# Patient Record
Sex: Female | Born: 1980
Health system: Southern US, Community
[De-identification: ages and names within clinical notes are randomized; demographics above are authoritative.]

## PROBLEM LIST (undated history)

## (undated) DIAGNOSIS — Z789 Other specified health status: Secondary | ICD-10-CM

## (undated) DIAGNOSIS — Z3490 Encounter for supervision of normal pregnancy, unspecified, unspecified trimester: Secondary | ICD-10-CM

---

## 2006-05-09 ENCOUNTER — Emergency Department (HOSPITAL_COMMUNITY): Admission: EM | Admit: 2006-05-09 | Discharge: 2006-05-09 | Payer: Self-pay | Admitting: Emergency Medicine

## 2006-06-13 ENCOUNTER — Emergency Department (HOSPITAL_COMMUNITY): Admission: EM | Admit: 2006-06-13 | Discharge: 2006-06-14 | Payer: Self-pay | Admitting: Emergency Medicine

## 2006-08-20 ENCOUNTER — Emergency Department (HOSPITAL_COMMUNITY): Admission: EM | Admit: 2006-08-20 | Discharge: 2006-08-20 | Payer: Self-pay | Admitting: *Deleted

## 2007-05-29 ENCOUNTER — Emergency Department (HOSPITAL_COMMUNITY): Admission: EM | Admit: 2007-05-29 | Discharge: 2007-05-29 | Payer: Self-pay | Admitting: Emergency Medicine

## 2008-02-27 ENCOUNTER — Emergency Department (HOSPITAL_COMMUNITY): Admission: EM | Admit: 2008-02-27 | Discharge: 2008-02-27 | Payer: Self-pay | Admitting: Emergency Medicine

## 2009-10-16 ENCOUNTER — Other Ambulatory Visit: Admission: RE | Admit: 2009-10-16 | Discharge: 2009-10-16 | Payer: Self-pay | Admitting: Obstetrics & Gynecology

## 2009-11-11 ENCOUNTER — Ambulatory Visit (HOSPITAL_COMMUNITY): Admission: RE | Admit: 2009-11-11 | Discharge: 2009-11-11 | Payer: Self-pay | Admitting: Obstetrics & Gynecology

## 2009-12-09 ENCOUNTER — Ambulatory Visit (HOSPITAL_COMMUNITY): Admission: RE | Admit: 2009-12-09 | Discharge: 2009-12-09 | Payer: Self-pay | Admitting: Obstetrics & Gynecology

## 2009-12-24 ENCOUNTER — Ambulatory Visit (HOSPITAL_COMMUNITY)
Admission: RE | Admit: 2009-12-24 | Discharge: 2009-12-24 | Payer: Self-pay | Source: Home / Self Care | Admitting: Obstetrics & Gynecology

## 2009-12-31 ENCOUNTER — Ambulatory Visit (HOSPITAL_COMMUNITY)
Admission: RE | Admit: 2009-12-31 | Discharge: 2009-12-31 | Payer: Self-pay | Source: Home / Self Care | Admitting: Obstetrics & Gynecology

## 2010-02-22 ENCOUNTER — Encounter: Payer: Self-pay | Admitting: Obstetrics & Gynecology

## 2010-05-15 ENCOUNTER — Inpatient Hospital Stay (HOSPITAL_COMMUNITY)
Admission: AD | Admit: 2010-05-15 | Discharge: 2010-05-18 | DRG: 766 | Disposition: A | Discharge status: Home / Self Care | Payer: Medicaid Other | Source: Ambulatory Visit | Attending: Obstetrics & Gynecology | Admitting: Obstetrics & Gynecology

## 2010-05-15 ENCOUNTER — Other Ambulatory Visit: Payer: Self-pay | Admitting: Obstetrics & Gynecology

## 2010-05-15 DIAGNOSIS — O323XX0 Maternal care for face, brow and chin presentation, not applicable or unspecified: Secondary | ICD-10-CM

## 2010-05-15 DIAGNOSIS — D649 Anemia, unspecified: Secondary | ICD-10-CM

## 2010-05-15 DIAGNOSIS — O9903 Anemia complicating the puerperium: Secondary | ICD-10-CM | POA: Diagnosis not present

## 2010-05-15 LAB — CBC
HCT: 38.8 % (ref 36.0–46.0)
Hemoglobin: 13.3 g/dL (ref 12.0–15.0)
MCHC: 34.3 g/dL (ref 30.0–36.0)
MCV: 94.2 fL (ref 78.0–100.0)
Platelets: 210 10*3/uL (ref 150–400)
RDW: 13.7 % (ref 11.5–15.5)
WBC: 19.6 10*3/uL — ABNORMAL HIGH (ref 4.0–10.5)

## 2010-05-15 LAB — RPR: RPR Ser Ql: NONREACTIVE

## 2010-05-15 LAB — RAPID URINE DRUG SCREEN, HOSP PERFORMED
Amphetamines: NOT DETECTED
Barbiturates: NOT DETECTED
Opiates: POSITIVE — AB

## 2010-05-16 LAB — CBC
HCT: 29.3 % — ABNORMAL LOW (ref 36.0–46.0)
Hemoglobin: 9.8 g/dL — ABNORMAL LOW (ref 12.0–15.0)
MCH: 32.2 pg (ref 26.0–34.0)
MCHC: 33.4 g/dL (ref 30.0–36.0)
MCV: 96.4 fL (ref 78.0–100.0)
RBC: 3.04 MIL/uL — ABNORMAL LOW (ref 3.87–5.11)
RDW: 14.2 % (ref 11.5–15.5)
WBC: 20 10*3/uL — ABNORMAL HIGH (ref 4.0–10.5)

## 2010-05-18 LAB — PREGNANCY, URINE: Preg Test, Ur: NEGATIVE

## 2010-05-21 ENCOUNTER — Emergency Department (HOSPITAL_COMMUNITY)
Admission: EM | Admit: 2010-05-21 | Discharge: 2010-05-22 | Disposition: A | Discharge status: Home / Self Care | Payer: Medicaid Other | Attending: Emergency Medicine | Admitting: Emergency Medicine

## 2010-05-21 DIAGNOSIS — R42 Dizziness and giddiness: Secondary | ICD-10-CM | POA: Insufficient documentation

## 2010-05-21 DIAGNOSIS — R110 Nausea: Secondary | ICD-10-CM | POA: Insufficient documentation

## 2010-05-21 DIAGNOSIS — R51 Headache: Secondary | ICD-10-CM | POA: Insufficient documentation

## 2010-05-25 NOTE — Op Note (Signed)
NAME:  [name] [name], [name] [name]                ACCOUNT NO.:  [id number]  MEDICAL RECORD NO.:  [id number]           PATIENT TYPE:  I  LOCATION:  9164                          FACILITY:  [facility]  PHYSICIAN:  Horton Chin, MD DATE OF BIRTH:  1980-12-09  DATE OF PROCEDURE:  05/15/2010 DATE OF DISCHARGE:                              OPERATIVE REPORT   PREOPERATIVE DIAGNOSES:  Intrauterine pregnancy at 38-3/7 weeks' gestation, prolonged fetal bradycardia, brow/face presentation, mentum posterior.  POSTOPERATIVE DIAGNOSES:  Intrauterine pregnancy at 38-3/7 weeks' gestation, prolonged fetal bradycardia, brow/face presentation, mentum posterior.  PROCEDURE:  Emergent primary low transverse cesarean section via Pfannenstiel incision.  SURGEON:  Horton Chin, MD  ANESTHESIOLOGIST:  Dr. Cristela Blue.  ANESTHESIA:  Epidural which was converted to general.  IV FLUIDS:  1100 mL of lactated Ringer's.  ESTIMATED BLOOD LOSS:  800 mL.  URINE OUTPUT:  300 mL.  INDICATIONS:  Ms.  [name] is a 30 year old gravida 3, para 0-2-0-2 who presented at 59 and 3 in spontaneous labor.  The patient was noted to have a concerning fetal heart rate tracing in the MAU with decelerations but they ameliorated after intrauterine resuscitation efforts.  She was noted to be about 2 cm and at that point was sent to labor and delivery. On labor and delivery, she was noted to have heart rate that was in the 120s with moderate variability, accelerations and was overall reactive and reassuring.  The patient was monitored in L and D and got an epidural around 5:00 a.m.  Around 5:45, she was examined and found to be 7 cm and then a few minutes later, the patient started to have prolonged deceleration to the 90s.  Her examination at that point was of 10 cm, +2 station, but brow/face mentum posterior presentation.  At that point,  there was a fetal heart rate bradycardia for over 7 minutes. Emergent cesarean section was  called and the patient was taken down to the operating room, the patient was able to be verbally consented and also written consent was signed.  FINDINGS:  Viable female infant in face presentation, mentum posterior. Apgars 7 and 9.  Arterial cord pH 7.202, weight 5 pounds 3 ounces. There was a large amount of blood that was noted in the amniotic cavity and a big retroplacental clot/abruption noted with detached placenta secondary to clot, and the placenta was otherwise intact with a three- vessel cord and normal uterus, tubes, and ovaries bilaterally.  SPECIMENS:  Placenta which was sent to Pathology.  COMPLICATIONS:  None immediate.  PROCEDURE DETAILS:  The patient was emergently taken to the operating room.  Her epidural was not found to be adequate and general anesthesia was quickly administered.  The patient was quickly prepped and draped and a time-out was done and when her anesthesia was found to be adequate a Pfannenstiel incision was made with a scalpel and taken to the underlying layer of fascia.  The fascia was incised bluntly.  The rectus muscles were separated bluntly and then the peritoneum was also entered bluntly.  The low transverse hysterotomy was made and extended bluntly and the fetus  was delivered in less than a minute after skin incision was made.  The umbilical cord was clamped and cut and the infant was handed over to the neonatologist.  The Apgars were 7 and 9, arterial cord pH was 7.202.  At this point, the placenta was noted to be partially detached secondary to large retroplacental clot.  This was delivered, the uterus was cleared of all clot and debris.  The hysterotomy was repaired using 0 Monocryl and running interlocking fashion.  A second layer of 0 Monocryl was used for an imbricating layer.  Hemostasis was confirmed on all surfaces.  The peritoneum was then reapproximated using 2-0 Vicryl in a running stitch and the rectus muscles were  also reapproximated using 2-0 Vicryl interrupted stitches.  The fascia was reapproximated using 0 Vicryl in a running stitch and subcutaneous layer was irrigated, and the skin was closed with 4-0 Monocryl subcuticular stitch.  The patient did receive Cefotetan 2 grams during the case and will have sequential compression devices applied to her lower extremities for DVT prophylaxis.  The patient tolerated the procedure well.  Sponge, instrument, and needle counts were correct x2.  She was taken to the recovery room awake, extubated, and in stable condition.     Horton Chin, MD     UAA/MEDQ  D:  05/15/2010  T:  05/15/2010  Job:  [id number]  Electronically Signed by Jaynie Collins MD on 05/25/2010 12:15:49 PM

## 2010-05-25 NOTE — Discharge Summary (Signed)
  NAME:  [name], [name]                ACCOUNT NO.:  [id number]  MEDICAL RECORD NO.:  [id number]           PATIENT TYPE:  I  LOCATION:  9145                          [facility]ACILITY:  [facility]  PHYSICIAN:  [name] Chin, MD DATE OF BIRTH:  [birth year]  DATE OF ADMISSION:  05/15/2010 DATE OF DISCHARGE:  05/18/2010                              DISCHARGE SUMMARY   REASON FOR ADMISSION:  Pregnancy at 40 weeks and with complaints of labor.  HISTORY OF PRESENT ILLNESS:  The patient was admitted to the MAU, was noted to have decels in the MAU, was admitted to Labor and Delivery. The patient was intially stable but then had a fetal bradycardia which resulted in an emergency low transverse cesarean section by  Dr. Macon Large.  The patient had a viable female infant.  Hospital course has essentially been uneventful.  The patient was ambulating well, taking oral fluids and solids well.  DIET AT DISCHARGE:  Regular diet.  ACTIVITY LEVEL:  No heavy lifting or driving for at least 2-3 weeks.  PHYSICAL EXAMINATION:  VITAL SIGNS:  Today, vital signs are stable. HEART:  Regular rhythm and rate. LUNGS:  Clear to auscultation bilaterally. ABDOMEN:  Soft.  Bowel sounds are present in all four quadrants. Incision is intact.  There is no redness, swelling, or discharge from the incision.  Fundus is firm.  Lochia small amount.  Discharge hemoglobin is 9.8, discharge hematocrit 29.3.  WBCs on discharge was 20.  Discharge medications are as follows: 1. Motrin 600 p.o. q.6 h p.r.n. pain and discomfort. 2. FeSO4 325 b.i.d.  FOLLOWUP:  She is to follow up at [facility] on Friday and at 4 weeks postpartum checkup.     [name] [name], N.M.   ______________________________ [name] Chin, MD    DL/MEDQ  D:  16/11/9602  T:  05/18/2010  Job:  [id number]  cc:   [facility]  Electronically Signed by Wyvonnia Dusky N.M. on 05/24/2010 09:00:26 AM Electronically Signed by Jaynie Collins MD on 05/25/2010  12:17:17 PM

## 2010-10-27 LAB — BASIC METABOLIC PANEL
BUN: 13
CO2: 22
Calcium: 9.3
Chloride: 106
Creatinine, Ser: 0.52
GFR calc Af Amer: >60
GFR calc non Af Amer: >60
Glucose, Bld: 106 — ABNORMAL HIGH
Potassium: 3.7
Sodium: 136

## 2010-10-27 LAB — CBC
HCT: 38
Hemoglobin: 13.4
MCV: 112.5 — ABNORMAL HIGH
Platelets: 293
RDW: 13.3
WBC: 14.8 — ABNORMAL HIGH

## 2010-10-27 LAB — DIFFERENTIAL
Basophils Absolute: 0
Basophils Relative: 0
Eosinophils Absolute: 0
Lymphocytes Relative: 9 — ABNORMAL LOW
Lymphs Abs: 1.3
Monocytes Relative: 4
Neutro Abs: 12.9 — ABNORMAL HIGH
Neutrophils Relative %: 87 — ABNORMAL HIGH

## 2010-10-27 LAB — RAPID URINE DRUG SCREEN, HOSP PERFORMED
Amphetamines: NOT DETECTED
Barbiturates: NOT DETECTED
Benzodiazepines: NOT DETECTED
Cocaine: NOT DETECTED
Opiates: POSITIVE — AB
Tetrahydrocannabinol: NOT DETECTED

## 2010-10-27 LAB — ETHANOL: Alcohol, Ethyl (B): <5

## 2010-10-27 LAB — PREGNANCY, URINE: Preg Test, Ur: NEGATIVE

## 2010-11-16 LAB — URINE MICROSCOPIC-ADD ON

## 2010-11-16 LAB — URINE CULTURE: Colony Count: >=100000

## 2010-11-16 LAB — URINALYSIS, ROUTINE W REFLEX MICROSCOPIC
Bilirubin Urine: NEGATIVE
Glucose, UA: NEGATIVE
Ketones, ur: NEGATIVE
Leukocytes, UA: NEGATIVE
Nitrite: POSITIVE — AB
Protein, ur: NEGATIVE
Specific Gravity, Urine: 1.02
Urobilinogen, UA: 1
pH: 7

## 2010-11-16 LAB — PREGNANCY, URINE: Preg Test, Ur: NEGATIVE

## 2010-11-16 LAB — BASIC METABOLIC PANEL
BUN: 11
CO2: 26
Chloride: 109
Creatinine, Ser: 0.64
Potassium: 3.2 — ABNORMAL LOW

## 2011-02-02 NOTE — L&D Delivery Note (Signed)
Delivery Note At 7:33 AM a viable and healthy female was delivered via VBAC, Spontaneous (Presentation: Left Occiput Anterior).  APGAR: 9, 9; weight 6 lb 1.2 oz (2755 g).   Placenta status: Intact, Spontaneous.  Cord: 3 vessels with the following complications: None.  Cord pH: unobtained.  Anesthesia: Epidural  Episiotomy: None Lacerations: None Suture Repair: n/a Est. Blood Loss (mL): 300  Mom to postpartum.  Baby to nursery-stable.  Simone Curia 09/28/2011, 10:22 AM

## 2011-02-02 NOTE — L&D Delivery Note (Signed)
I have seen and examined this patient and I agree with the above. Cam Hai 9:28 PM 09/28/2011

## 2011-02-26 ENCOUNTER — Other Ambulatory Visit: Payer: Self-pay | Admitting: Obstetrics and Gynecology

## 2011-02-26 ENCOUNTER — Other Ambulatory Visit (HOSPITAL_COMMUNITY)
Admission: RE | Admit: 2011-02-26 | Discharge: 2011-02-26 | Disposition: A | Discharge status: Home / Self Care | Payer: Medicaid Other | Source: Ambulatory Visit | Attending: Obstetrics and Gynecology | Admitting: Obstetrics and Gynecology

## 2011-02-26 DIAGNOSIS — Z113 Encounter for screening for infections with a predominantly sexual mode of transmission: Secondary | ICD-10-CM | POA: Insufficient documentation

## 2011-02-26 DIAGNOSIS — Z01419 Encounter for gynecological examination (general) (routine) without abnormal findings: Secondary | ICD-10-CM | POA: Insufficient documentation

## 2011-02-26 LAB — OB RESULTS CONSOLE RUBELLA ANTIBODY, IGM: Rubella: IMMUNE

## 2011-02-26 LAB — OB RESULTS CONSOLE ABO/RH: RH Type: POSITIVE

## 2011-02-26 LAB — OB RESULTS CONSOLE HEPATITIS B SURFACE ANTIGEN: Hepatitis B Surface Ag: NEGATIVE

## 2011-02-26 LAB — OB RESULTS CONSOLE ANTIBODY SCREEN: Antibody Screen: NEGATIVE

## 2011-03-01 LAB — OB RESULTS CONSOLE GC/CHLAMYDIA: Chlamydia: NEGATIVE

## 2011-06-04 ENCOUNTER — Encounter (HOSPITAL_COMMUNITY): Payer: Self-pay | Admitting: *Deleted

## 2011-06-04 ENCOUNTER — Emergency Department (HOSPITAL_COMMUNITY): Payer: Medicaid Other

## 2011-06-04 ENCOUNTER — Emergency Department (HOSPITAL_COMMUNITY)
Admission: EM | Admit: 2011-06-04 | Discharge: 2011-06-05 | Disposition: A | Discharge status: Home / Self Care | Payer: Medicaid Other | Attending: Emergency Medicine | Admitting: Emergency Medicine

## 2011-06-04 DIAGNOSIS — Z331 Pregnant state, incidental: Secondary | ICD-10-CM

## 2011-06-04 DIAGNOSIS — Z3490 Encounter for supervision of normal pregnancy, unspecified, unspecified trimester: Secondary | ICD-10-CM

## 2011-06-04 DIAGNOSIS — R0989 Other specified symptoms and signs involving the circulatory and respiratory systems: Secondary | ICD-10-CM | POA: Insufficient documentation

## 2011-06-04 DIAGNOSIS — R0602 Shortness of breath: Secondary | ICD-10-CM | POA: Insufficient documentation

## 2011-06-04 DIAGNOSIS — F17200 Nicotine dependence, unspecified, uncomplicated: Secondary | ICD-10-CM | POA: Insufficient documentation

## 2011-06-04 DIAGNOSIS — J40 Bronchitis, not specified as acute or chronic: Secondary | ICD-10-CM

## 2011-06-04 DIAGNOSIS — R0609 Other forms of dyspnea: Secondary | ICD-10-CM | POA: Insufficient documentation

## 2011-06-04 DIAGNOSIS — I4510 Unspecified right bundle-branch block: Secondary | ICD-10-CM | POA: Insufficient documentation

## 2011-06-04 HISTORY — DX: Encounter for supervision of normal pregnancy, unspecified, unspecified trimester: Z34.90

## 2011-06-04 MED ORDER — IOHEXOL 350 MG/ML SOLN
100.0000 mL | Freq: Once | INTRAVENOUS | Status: AC | PRN
  Administered 2011-06-04: 100 mL via INTRAVENOUS

## 2011-06-04 MED ORDER — ALBUTEROL SULFATE (5 MG/ML) 0.5% IN NEBU
2.5000 mg | INHALATION_SOLUTION | Freq: Once | RESPIRATORY_TRACT | Status: AC
  Administered 2011-06-04: 5 mg via RESPIRATORY_TRACT
  Filled 2011-06-04: qty 1

## 2011-06-04 NOTE — ED Notes (Signed)
Pt reports general cold like symptoms, since Tuesday.  Reports non productive cough, sore throat, body aches.  Denies improvement. No acute distress noted. No fever at this time.

## 2011-06-04 NOTE — ED Notes (Signed)
Cough, cold sx since Tuesday,  Non productive.  Pregnant,21 weeks.

## 2011-06-04 NOTE — ED Provider Notes (Signed)
This chart was scribed for [name] Octave, MD by [name] [name]. The patient was seen in room APA01/APA01 and the patient's care was started at 9:29 PM.   CSN: [id number]  Arrival date & time 06/04/11  2111   First MD Initiated Contact with Patient 06/04/11 2126      Chief Complaint  Patient presents with  . Shortness of Breath    (Consider location/radiation/quality/duration/timing/severity/associated sxs/prior treatment) HPI  [name] [name] is a 31 y.o. female who presents to the Emergency Department complaining of sudden onset, persistence of constant, gradually worsening, moderate SOB onset today.  SOB is exacerbated by exertion.  Pt denies any chest pain.  Pt has had a cold for three days and started coughing yesterday.    Denies pain or swelling in legs, stomach pain, bleeding, blood clots, back pin.  Pt is [redacted] weeks pregnant. Pt used albuterol w/ some relief of sx.  Pt states that she smokes a half a pack of cigarettes every day.There are no other associated symptoms and no other alleviating or aggravating factors.  Past Medical History  Diagnosis Date  . Pregnant 06/04/11    expected delivery date: 10/13/11    History reviewed. No pertinent past surgical history.  History reviewed. No pertinent family history.  History  Substance Use Topics  . Smoking status: Current Everyday Smoker  . Smokeless tobacco: Not on file  . Alcohol Use: No    OB History    Grav Para Term Preterm Abortions TAB SAB Ect Mult Living   1               Review of Systems  10 Systems reviewed and all are negative for acute change except as noted in the HPI.     Allergies  Review of patient's allergies indicates no known allergies.  Home Medications  No current outpatient prescriptions on file.  BP 116/71  Pulse 96  Temp(Src) 97.1 F (36.2 C) (Oral)  Resp 20  Ht 5\' 2"  (1.575 m)  Wt 122 lb (55.339 kg)  BMI 22.31 kg/m2  SpO2 98%  Physical Exam  Nursing note and vitals  reviewed. Constitutional: She is oriented to person, place, and time. She appears well-developed and well-nourished. No distress.  HENT:  Head: Normocephalic and atraumatic.  Eyes: EOM are normal. Pupils are equal, round, and reactive to light.  Neck: Normal range of motion. Neck supple. No tracheal deviation present.  Cardiovascular: Normal rate and regular rhythm.   Pulmonary/Chest: Effort normal and breath sounds normal. No respiratory distress. She has no wheezes.       Lungs are clear  Abdominal: Soft. She exhibits no distension.       Gravid and nontender  Musculoskeletal: Normal range of motion. She exhibits no edema.  Neurological: She is alert and oriented to person, place, and time. No sensory deficit.  Skin: Skin is warm and dry.  Psychiatric: She has a normal mood and affect. Her behavior is normal.    ED Course  Procedures (including critical care time) DIAGNOSTIC STUDIES: Oxygen Saturation is 100% on room air, normal by my interpretation.    COORDINATION OF CARE:    Labs Reviewed - No data to display Ct Angio Chest W/cm &/or Wo Cm  06/04/2011  *RADIOLOGY REPORT*  Clinical Data: Shortness of breath; dyspnea on exertion.  Cough. History of smoking.  [redacted] weeks pregnant.  CT ANGIOGRAPHY CHEST  Technique:  Multidetector CT imaging of the chest using the standard protocol during bolus administration of intravenous contrast.  Multiplanar reconstructed images including MIPs were obtained and reviewed to evaluate the vascular anatomy.  The patient talked with Dr. Margo [name] regarding the study, its benefits, risks, and alternatives, and regarding IV contrast and radiation dosage prior to the study.  Contrast: OMNIPAQUE IOHEXOL 350 MG/ML SOLN  Comparison: Chest radiograph performed 06/14/2006  Findings: There is no evidence of pulmonary embolus.  Scattered ground-glass opacities are noted within both lungs, more prominent on the left.  This could reflect an unusual infectious process,  mild interstitial edema, or a variety of additional etiologies.  There is no evidence of pleural effusion or pneumothorax.  No masses are identified; no abnormal focal contrast enhancement is seen; the lung bases are incompletely imaged on this study.  The mediastinum is unremarkable in appearance.  No mediastinal lymphadenopathy is seen.  No pericardial effusion is identified. No axillary lymphadenopathy is seen.  The visualized portions of the thyroid gland are unremarkable in appearance.  The visualized portions of the liver and spleen are unremarkable.  No acute osseous abnormalities are seen.  IMPRESSION:  1.  No evidence of pulmonary embolus. 2.  Scattered ground-glass opacities within both lungs, more prominent on the left.  This could reflect an unusual infectious process, mild interstitial edema, or a variety of additional etiologies.  Original Report Authenticated By: Tonia Ghent, M.D.     No diagnosis found.    MDM  [redacted] weeks pregnant with cough, shortness of breath, dyspnea on exertion for the past 2 days. She continues to smoke. Denies any lower extremity pain or swelling. Lungs are clear to auscultation without wheezing. No abdominal pain, vaginal bleeding or pregnancy-related complaints.  Patient's symptoms appear to be related to smoking and bronchitis. However she does have significant dyspnea with exertion, shortness of breath and EKG abnormalities. Risks and benefits of CT scan to diagnose pulmonary embolism discussed with patient. He agrees to proceed.  CT negative for pulmonary embolism. Patient strongly advised to stop smoking especially while pregnant. We'll treat conservatively for bronchitis with cool mist humidifiers, acetaminophen, tobacco cessation.   Date: 06/04/2011  Rate: 82  Rhythm: normal sinus rhythm  QRS Axis: right  Intervals: normal  ST/T Wave abnormalities: nonspecific ST/T changes  Conduction Disutrbances:right bundle branch block  Narrative  Interpretation: S1 Q 3 T3  Old EKG Reviewed: none available      I personally performed the services described in this documentation, which was scribed in my presence.  The recorded information has been reviewed and considered.        [name] Octave, MD 06/04/11 [phone]

## 2011-08-25 ENCOUNTER — Encounter: Payer: Medicaid Other | Attending: Obstetrics & Gynecology | Admitting: *Deleted

## 2011-08-25 ENCOUNTER — Ambulatory Visit: Payer: Medicaid Other

## 2011-08-25 DIAGNOSIS — Z713 Dietary counseling and surveillance: Secondary | ICD-10-CM | POA: Insufficient documentation

## 2011-08-25 DIAGNOSIS — O99810 Abnormal glucose complicating pregnancy: Secondary | ICD-10-CM | POA: Insufficient documentation

## 2011-08-26 ENCOUNTER — Encounter: Payer: Self-pay | Admitting: *Deleted

## 2011-08-26 NOTE — Progress Notes (Signed)
  Patient was seen on 08/25/2011 for Gestational Diabetes self-management class at the [facility]. The following learning objectives were met by the patient during this course:   States the definition of Gestational Diabetes  States why dietary management is important in controlling blood glucose  Describes the effects each nutrient has on blood glucose levels  Demonstrates ability to create a balanced meal plan  Demonstrates carbohydrate counting   States when to check blood glucose levels  Demonstrates proper blood glucose monitoring techniques  States the effect of stress and exercise on blood glucose levels  States the importance of limiting caffeine and abstaining from alcohol and smoking  Blood glucose monitor given:  One Touch Ultra Mini Self Monitoring Kit Lot # [id number] X Exp: 02/2012 Blood glucose reading: 88 mg/dl Pt had difficulty lancing her own finger, may need assistance at home.  Patient instructed to monitor glucose levels: FBS: 60 - <90 2 hour: <120  *Patient received handouts:  Nutrition Diabetes and Pregnancy  Carbohydrate Counting List  Patient will be seen for follow-up as needed.

## 2011-08-26 NOTE — Patient Instructions (Signed)
Goals:  Check glucose levels per MD as instructed  Follow Gestational Diabetes Diet as instructed  Call for follow-up as needed    

## 2011-09-28 ENCOUNTER — Inpatient Hospital Stay (HOSPITAL_COMMUNITY): Payer: Medicaid Other | Admitting: Anesthesiology

## 2011-09-28 ENCOUNTER — Encounter (HOSPITAL_COMMUNITY): Payer: Self-pay | Admitting: Anesthesiology

## 2011-09-28 ENCOUNTER — Encounter (HOSPITAL_COMMUNITY): Payer: Self-pay | Admitting: *Deleted

## 2011-09-28 ENCOUNTER — Inpatient Hospital Stay (HOSPITAL_COMMUNITY)
Admission: AD | Admit: 2011-09-28 | Discharge: 2011-09-30 | DRG: 775 | Disposition: A | Discharge status: Home / Self Care | Payer: Medicaid Other | Source: Ambulatory Visit | Attending: Obstetrics & Gynecology | Admitting: Obstetrics & Gynecology

## 2011-09-28 DIAGNOSIS — O34219 Maternal care for unspecified type scar from previous cesarean delivery: Secondary | ICD-10-CM

## 2011-09-28 LAB — GROUP B STREP BY PCR: Group B strep by PCR: INVALID — AB

## 2011-09-28 LAB — OB RESULTS CONSOLE ABO/RH: RH Type: POSITIVE

## 2011-09-28 LAB — CBC
Hemoglobin: 12.9 g/dL (ref 12.0–15.0)
MCH: 32.3 pg (ref 26.0–34.0)
MCHC: 34.6 g/dL (ref 30.0–36.0)
MCV: 93.3 fL (ref 78.0–100.0)

## 2011-09-28 LAB — RPR: RPR Ser Ql: NONREACTIVE

## 2011-09-28 LAB — TYPE AND SCREEN

## 2011-09-28 LAB — OB RESULTS CONSOLE HIV ANTIBODY (ROUTINE TESTING): HIV: NONREACTIVE

## 2011-09-28 LAB — OB RESULTS CONSOLE HEPATITIS B SURFACE ANTIGEN: Hepatitis B Surface Ag: NEGATIVE

## 2011-09-28 LAB — OB RESULTS CONSOLE RPR: RPR: NONREACTIVE

## 2011-09-28 MED ORDER — FENTANYL 2.5 MCG/ML BUPIVACAINE 1/10 % EPIDURAL INFUSION (WH - ANES)
14.0000 mL/h | INTRAMUSCULAR | Status: DC
  Administered 2011-09-28: 12 mL/h via EPIDURAL
  Filled 2011-09-28: qty 60

## 2011-09-28 MED ORDER — ACETAMINOPHEN 325 MG PO TABS: 650.0000 mg | ORAL_TABLET | ORAL | Status: DC | PRN

## 2011-09-28 MED ORDER — DIBUCAINE 1 % RE OINT: 1.0000 "application " | TOPICAL_OINTMENT | RECTAL | Status: DC | PRN

## 2011-09-28 MED ORDER — FLEET ENEMA 7-19 GM/118ML RE ENEM: 1.0000 | ENEMA | RECTAL | Status: DC | PRN

## 2011-09-28 MED ORDER — ZOLPIDEM TARTRATE 5 MG PO TABS: 5.0000 mg | ORAL_TABLET | Freq: Every evening | ORAL | Status: DC | PRN

## 2011-09-28 MED ORDER — PHENYLEPHRINE 40 MCG/ML (10ML) SYRINGE FOR IV PUSH (FOR BLOOD PRESSURE SUPPORT)
80.0000 ug | PREFILLED_SYRINGE | INTRAVENOUS | Status: DC | PRN
  Filled 2011-09-28: qty 5

## 2011-09-28 MED ORDER — OXYTOCIN 40 UNITS IN LACTATED RINGERS INFUSION - SIMPLE MED
62.5000 mL/h | Freq: Once | INTRAVENOUS | Status: AC
  Administered 2011-09-28: 62.5 mL/h via INTRAVENOUS
  Filled 2011-09-28: qty 1000

## 2011-09-28 MED ORDER — BUPRENORPHINE HCL 8 MG SL SUBL
8.0000 mg | SUBLINGUAL_TABLET | Freq: Three times a day (TID) | SUBLINGUAL | Status: DC
  Administered 2011-09-28 – 2011-09-30 (×7): 8 mg via SUBLINGUAL
  Filled 2011-09-28 (×7): qty 1

## 2011-09-28 MED ORDER — NALBUPHINE SYRINGE 5 MG/0.5 ML: 10.0000 mg | INJECTION | INTRAMUSCULAR | Status: DC | PRN

## 2011-09-28 MED ORDER — BENZOCAINE-MENTHOL 20-0.5 % EX AERO
1.0000 "application " | INHALATION_SPRAY | CUTANEOUS | Status: DC | PRN
  Administered 2011-09-28: 1 via TOPICAL
  Filled 2011-09-28: qty 56

## 2011-09-28 MED ORDER — OXYCODONE-ACETAMINOPHEN 5-325 MG PO TABS: 1.0000 | ORAL_TABLET | ORAL | Status: DC | PRN

## 2011-09-28 MED ORDER — FENTANYL 2.5 MCG/ML BUPIVACAINE 1/10 % EPIDURAL INFUSION (WH - ANES)
INTRAMUSCULAR | Status: DC | PRN
Start: 2011-09-28 — End: 2011-09-28
  Administered 2011-09-28: 14 mL/h via EPIDURAL

## 2011-09-28 MED ORDER — LANOLIN HYDROUS EX OINT: TOPICAL_OINTMENT | CUTANEOUS | Status: DC | PRN

## 2011-09-28 MED ORDER — PRENATAL MULTIVITAMIN CH
1.0000 | ORAL_TABLET | Freq: Every day | ORAL | Status: DC
  Administered 2011-09-29 – 2011-09-30 (×2): 1 via ORAL
  Filled 2011-09-28 (×2): qty 1

## 2011-09-28 MED ORDER — IBUPROFEN 600 MG PO TABS
600.0000 mg | ORAL_TABLET | Freq: Four times a day (QID) | ORAL | Status: DC | PRN
  Administered 2011-09-28: 600 mg via ORAL
  Filled 2011-09-28: qty 1

## 2011-09-28 MED ORDER — CITRIC ACID-SODIUM CITRATE 334-500 MG/5ML PO SOLN
30.0000 mL | ORAL | Status: DC | PRN
  Filled 2011-09-28: qty 15

## 2011-09-28 MED ORDER — LACTATED RINGERS IV SOLN
INTRAVENOUS | Status: DC
  Administered 2011-09-28: 03:00:00 via INTRAVENOUS

## 2011-09-28 MED ORDER — EPHEDRINE 5 MG/ML INJ
10.0000 mg | INTRAVENOUS | Status: DC | PRN
  Administered 2011-09-28: 10 mg via INTRAVENOUS
  Filled 2011-09-28 (×2): qty 4

## 2011-09-28 MED ORDER — ONDANSETRON HCL 4 MG/2ML IJ SOLN: 4.0000 mg | INTRAMUSCULAR | Status: DC | PRN

## 2011-09-28 MED ORDER — LIDOCAINE HCL (PF) 1 % IJ SOLN
INTRAMUSCULAR | Status: DC | PRN
  Administered 2011-09-28 (×2): 4 mL

## 2011-09-28 MED ORDER — SENNOSIDES-DOCUSATE SODIUM 8.6-50 MG PO TABS
2.0000 | ORAL_TABLET | Freq: Every day | ORAL | Status: DC
  Administered 2011-09-28 – 2011-09-29 (×2): 2 via ORAL

## 2011-09-28 MED ORDER — LACTATED RINGERS IV SOLN
500.0000 mL | INTRAVENOUS | Status: DC | PRN
  Administered 2011-09-28: 500 mL via INTRAVENOUS

## 2011-09-28 MED ORDER — TETANUS-DIPHTH-ACELL PERTUSSIS 5-2.5-18.5 LF-MCG/0.5 IM SUSP: 0.5000 mL | Freq: Once | INTRAMUSCULAR | Status: DC

## 2011-09-28 MED ORDER — ONDANSETRON HCL 4 MG/2ML IJ SOLN: 4.0000 mg | Freq: Four times a day (QID) | INTRAMUSCULAR | Status: DC | PRN

## 2011-09-28 MED ORDER — DIPHENHYDRAMINE HCL 25 MG PO CAPS: 25.0000 mg | ORAL_CAPSULE | Freq: Four times a day (QID) | ORAL | Status: DC | PRN

## 2011-09-28 MED ORDER — OXYTOCIN BOLUS FROM INFUSION
250.0000 mL | Freq: Once | INTRAVENOUS | Status: AC
  Administered 2011-09-28: 250 mL via INTRAVENOUS
  Filled 2011-09-28: qty 500

## 2011-09-28 MED ORDER — DIPHENHYDRAMINE HCL 50 MG/ML IJ SOLN: 12.5000 mg | INTRAMUSCULAR | Status: DC | PRN

## 2011-09-28 MED ORDER — LIDOCAINE HCL (PF) 1 % IJ SOLN
30.0000 mL | INTRAMUSCULAR | Status: DC | PRN
  Filled 2011-09-28: qty 30

## 2011-09-28 MED ORDER — SIMETHICONE 80 MG PO CHEW: 80.0000 mg | CHEWABLE_TABLET | ORAL | Status: DC | PRN

## 2011-09-28 MED ORDER — EPHEDRINE 5 MG/ML INJ: 10.0000 mg | INTRAVENOUS | Status: DC | PRN

## 2011-09-28 MED ORDER — IBUPROFEN 600 MG PO TABS
600.0000 mg | ORAL_TABLET | Freq: Four times a day (QID) | ORAL | Status: DC
  Administered 2011-09-28 – 2011-09-30 (×7): 600 mg via ORAL
  Filled 2011-09-28 (×8): qty 1

## 2011-09-28 MED ORDER — WITCH HAZEL-GLYCERIN EX PADS: 1.0000 "application " | MEDICATED_PAD | CUTANEOUS | Status: DC | PRN

## 2011-09-28 MED ORDER — LACTATED RINGERS IV SOLN
500.0000 mL | Freq: Once | INTRAVENOUS | Status: AC
  Administered 2011-09-28: 500 mL via INTRAVENOUS

## 2011-09-28 MED ORDER — ONDANSETRON HCL 4 MG PO TABS: 4.0000 mg | ORAL_TABLET | ORAL | Status: DC | PRN

## 2011-09-28 MED ORDER — BUPRENORPHINE HCL 8 MG SL SUBL: 8.0000 mg | SUBLINGUAL_TABLET | Freq: Three times a day (TID) | SUBLINGUAL | Status: DC

## 2011-09-28 NOTE — Anesthesia Postprocedure Evaluation (Signed)
  Anesthesia Post-op Note  Patient: [name] [name]  Procedure(s) Performed: * No procedures listed *  Patient Location: PACU and Mother/Baby  Anesthesia Type: Epidural  Level of Consciousness: awake, alert , oriented and patient cooperative  Airway and Oxygen Therapy: Patient Spontanous Breathing  Post-op Pain: none  Post-op Assessment: Post-op Vital signs reviewed and Patient's Cardiovascular Status Stable  Post-op Vital Signs: Reviewed and stable  Complications: No apparent anesthesia complications

## 2011-09-28 NOTE — Anesthesia Procedure Notes (Signed)
Epidural Patient location during procedure: OB Start time: 09/28/2011 5:02 AM  Staffing Anesthesiologist: Malen Gauze, Nasteho Glantz A. Performed by: anesthesiologist   Preanesthetic Checklist Completed: patient identified, site marked, surgical consent, pre-op evaluation, timeout performed, IV checked, risks and benefits discussed and monitors and equipment checked  Epidural Patient position: sitting Prep: site prepped and draped and DuraPrep Patient monitoring: continuous pulse ox and blood pressure Approach: midline Injection technique: LOR air  Needle:  Needle type: Tuohy  Needle gauge: 17 G Needle length: 9 cm Needle insertion depth: 4 cm Catheter type: closed end flexible Catheter size: 19 Gauge Catheter at skin depth: 9 cm Test dose: negative and Other  Assessment Events: blood not aspirated, injection not painful, no injection resistance, negative IV test and no paresthesia  Additional Notes Patient identified. Risks and benefits discussed including failed block, incomplete  Pain control, post dural puncture headache, nerve damage, paralysis, blood pressure Changes, nausea, vomiting, reactions to medications-both toxic and allergic and post Partum back pain. All questions were answered. Patient expressed understanding and wished to proceed. Sterile technique was used throughout procedure. Epidural site was Dressed with sterile barrier dressing. No paresthesias, signs of intravascular injection Or signs of intrathecal spread were encountered.  Patient was more comfortable after the epidural was dosed. Please see RN's note for documentation of vital signs and FHR which are stable.

## 2011-09-28 NOTE — Anesthesia Preprocedure Evaluation (Addendum)
Anesthesia Evaluation  Patient identified by MRN, date of birth, ID band Patient awake    Reviewed: Allergy & Precautions, H&P , Patient's Chart, lab work & pertinent test results  Airway Mallampati: II TM Distance: >3 FB Neck ROM: full    Dental No notable dental hx. (+) Teeth Intact   Pulmonary neg pulmonary ROS, Current Smoker,  breath sounds clear to auscultation  Pulmonary exam normal       Cardiovascular negative cardio ROS  Rhythm:regular Rate:Normal     Neuro/Psych negative neurological ROS  negative psych ROS   GI/Hepatic negative GI ROS, (+)     substance abuse   ,   Endo/Other  negative endocrine ROS  Renal/GU negative Renal ROS  negative genitourinary   Musculoskeletal negative musculoskeletal ROS (+)   Abdominal Normal abdominal exam  (+)   Peds  Hematology negative hematology ROS (+)   Anesthesia Other Findings   Reproductive/Obstetrics (+) Pregnancy Previous C/Section                          Anesthesia Physical Anesthesia Plan  ASA: II  Anesthesia Plan: Epidural   Post-op Pain Management:    Induction:   Airway Management Planned:   Additional Equipment:   Intra-op Plan:   Post-operative Plan:   Informed Consent: I have reviewed the patients History and Physical, chart, labs and discussed the procedure including the risks, benefits and alternatives for the proposed anesthesia with the patient or authorized representative who has indicated his/her understanding and acceptance.     Plan Discussed with: Anesthesiologist  Anesthesia Plan Comments:         Anesthesia Quick Evaluation

## 2011-09-28 NOTE — H&P (Signed)
[name] [name] is a 31 y.o. female presenting for eval of labor. Denies leak or bldg. Receives prenatal care at [facility] and her preg has been remarkable for 1) SVD x 2 then LTCS for face presentation 2) Subutex (Lortab addiction) 3) unknown GBS (PCR collected) 4) elevated glucola with nl 3hr GTT History OB History    Grav Para Term Preterm Abortions TAB SAB Ect Mult Living   4 3 2 1     1 4      Past Medical History  Diagnosis Date  . Pregnant 06/04/11    expected delivery date: 10/13/11   History reviewed. No pertinent past surgical history. Family History: family history includes Cancer in her other; Hyperlipidemia in her other; and Hypertension in her other. Social History:  reports that she has been smoking.  She does not have any smokeless tobacco history on file. She reports that she does not drink alcohol or use illicit drugs.   Prenatal Transfer Tool  Maternal Diabetes: No Genetic Screening: Normal Maternal Ultrasounds/Referrals: Normal Fetal Ultrasounds or other Referrals:  None Maternal Substance Abuse:  Yes:  Type: Other: Lortab addiction Significant Maternal Medications:  Meds include: Other: Subutex 8mg  TID Significant Maternal Lab Results:  Lab values include: Other: GBS unk; PCR pending Other Comments:  None  ROS  Dilation: 9 Exam by:: Clelia Croft CNM Blood pressure 122/82, pulse 100, temperature 97.9 F (36.6 C), temperature source Oral, resp. rate 20, height 5\' 3"  (1.6 m), weight 58.06 kg (128 lb). Maternal Exam:  Uterine Assessment: Ctx q 2-4 min  Cervix: Cx 9/90/0; AROM for clear fluid  Fetal Exam Fetal Monitor Review: Baseline rate: 120.  Variability: moderate (6-25 bpm).   Pattern: accelerations present and no decelerations.       Physical Exam  Constitutional: She is oriented to person, place, and time. She appears well-developed and well-nourished.  HENT:  Head: Normocephalic.  Cardiovascular: Normal rate.   Respiratory: Effort normal.    Genitourinary: Vagina normal.  Neurological: She is alert and oriented to person, place, and time.  Skin: Skin is warm and dry.  Psychiatric: She has a normal mood and affect. Her behavior is normal. Thought content normal.    Prenatal labs: ABO, Rh: O/Positive/-- (08/27 0217) Antibody: Negative (08/27 0217) Rubella: Immune (08/27 0217) RPR: Nonreactive (08/27 0217)  HBsAg: Negative (08/27 0217)  HIV: Non-reactive (08/27 0217)  GBS:   unk; PCR pending  Assessment/Plan: IUP at 37.6wks Active labor  Admit to [facility] Expectant management Anticipate SVD Risk factor-based tx for GBS (none present currently)   SHAW, KIMBERLY 09/28/2011, 2:51 AM

## 2011-09-28 NOTE — MAU Note (Signed)
PT SAYS HURT BAD AT 7PM.   NOT SROM.  IN RM 6- sve DONE-9CM-100%-0 STATION[place] SHE WANTS A VAGINAL DEIVERY-THE LAST BABY WAS A C-SECTION

## 2011-09-28 NOTE — Progress Notes (Signed)
UR Chart review completed.  

## 2011-09-29 MED ORDER — SENNOSIDES-DOCUSATE SODIUM 8.6-50 MG PO TABS: 2.0000 | ORAL_TABLET | Freq: Every day | ORAL | Status: AC

## 2011-09-29 MED ORDER — PNEUMOCOCCAL VAC POLYVALENT 25 MCG/0.5ML IJ INJ
0.5000 mL | INJECTION | Freq: Once | INTRAMUSCULAR | Status: DC
  Filled 2011-09-29: qty 0.5

## 2011-09-29 MED ORDER — PNEUMOCOCCAL VAC POLYVALENT 25 MCG/0.5ML IJ INJ: 0.5000 mL | INJECTION | INTRAMUSCULAR | Status: DC

## 2011-09-29 NOTE — Progress Notes (Signed)
Post Partum Day 1  Subjective: no complaints, up ad lib, voiding, tolerating PO and + flatus, Denies CP, SOB, excessive bleeding  Objective: Blood pressure 102/61, pulse 67, temperature 98.2 F (36.8 C), temperature source Oral, resp. rate 18, height 5\' 3"  (1.6 m), weight 58.06 kg (128 lb), SpO2 93.00%, unknown if currently breastfeeding.  Physical Exam:  General: alert, cooperative, appears stated age and no distress CV: RRR, 2+ DP pulses bilaterally PULM: CTAB, no increased WOB ABD: soft, nontender Lochia: appropriate Uterine Fundus: firm Incision: n/a DVT Evaluation: No evidence of DVT seen on physical exam. No cords or calf tenderness. No significant calf/ankle edema.   Basename 09/28/11 0210  HGB 12.9  HCT 37.3    Assessment/Plan: Home with mother once observed 48 hours due to mother's GBS unknown status with pending testing Mom planning for outpatient circumcision at [facility]. Mother with h/o lortab addiction during pregnancy to continue subutex. Mom planning mirena and bottle feeding   LOS: 1 day   Simone Curia 09/29/2011, 9:23 AM

## 2011-09-29 NOTE — Discharge Summary (Signed)
Obstetric Discharge Summary Reason for Admission: onset of labor Prenatal Procedures: none Intrapartum Procedures: spontaneous vaginal delivery, unknown GBS status, tested and ungrown Postpartum Procedures: none Complications-Operative and Postpartum: none Hemoglobin  Date Value Range Status  09/28/2011 12.9  12.0 - 15.0 g/dL Final     HCT  Date Value Range Status  09/28/2011 37.3  36.0 - 46.0 % Final  Pt up walking, eating, drinking, pain controlled, passing flatus but no BM, voiding.  Denies CP, SOB, excessive bleeding  Physical Exam:  General: alert, cooperative, appears stated age and no distress CV: RRR, 2+ DP pulses bilaterally PULM: CTAB, no increased WOB ABD: soft, nontender Lochia: appropriate Uterine Fundus: firm Incision: n/a DVT Evaluation: No evidence of DVT seen on physical exam. No cords or calf tenderness. No significant calf/ankle edema.  Discharge Diagnoses: Term Pregnancy-delivered  Discharge Information: Date: 09/29/2011 Activity: pelvic rest Diet: routine Medications: PNV, Ibuprofen and Colace Condition: stable Instructions: refer to practice specific booklet Discharge to: home   Newborn Data: Live born female  Birth Weight: 6 lb 1.2 oz (2755 g) APGAR: 9, 9  Home with mother. Mom planning for outpatient circumcision at [facility]. GBS tested and unknown. Mother with h/o lortab addiction during pregnancy.  Simone Curia 09/29/2011, 7:48 AM  I have seen and examined this patient and I agree with the above. Cam Hai 8:26 PM 10/12/2011

## 2011-09-29 NOTE — Clinical Social Work Maternal (Signed)
    Clinical Social Work Department PSYCHOSOCIAL ASSESSMENT - MATERNAL/CHILD 09/29/2011  Patient:  KEYRY, IRACHETA  Account Number:  [id number]  Admit Date:  09/28/2011  Marjo Bicker Name:   Renae Gloss    Clinical Social Worker:  Andy Gauss   Date/Time:  09/29/2011 02:54 PM  Date Referred:  09/29/2011   Referral source  CN     Referred reason  Substance Abuse   Other referral source:    I:  FAMILY / HOME ENVIRONMENT Child's legal guardian:  PARENT  Guardian - Name Guardian - Age Guardian - Address  Trenae Brunke [address]; [place], [place] [address]  Dusty Blumstein 32 (same as above)   Other household support members/support persons Name Relationship DOB   SON 2003   SON 2005 / 2005   SON 2012   Other support:    II  PSYCHOSOCIAL DATA Information Source:  Patient Interview  Event organiser Employment:   Surveyor, quantity resources:  [employer] If Medicaid - 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  Pt lives with her spouse and children.  She was initially prescribed Subutex during her pregnancy in 2005 to treat "really bad headaches."  Pt continued to take the medication, after pregnancy and became addicted.  She started taking Subutex 4 years ago.  Currently, she is prescribed 8mg  of Subutex TID.  Pt is under care of Carroll Sage, MD.  Pt is aware of the possibility that  the baby may experience withdrawal symptoms.  Pt denies any hx of illegal substance use.  UDS is negative, meconium result are pending.  Pt has the necessary supplies for the infant. Pt's spouse at the bedside and supportive.  Pt appears to appropriate and bonding well with the infant.  Sw available to assist further if needed.      VI SOCIAL WORK PLAN Social Work Plan  No Further Intervention Required / No Barriers to Discharge   Type of pt/family education:   If child protective services report - county:   If child protective services report - date:   Information/referral to community resources comment:   Other social work plan:

## 2011-09-30 LAB — CULTURE, BETA STREP (GROUP B ONLY)

## 2011-09-30 NOTE — Discharge Planning (Signed)
Obstetric Discharge Summary Reason for Admission: onset of labor Prenatal Procedures: none Intrapartum Procedures: spontaneous vaginal delivery Postpartum Procedures: none Complications-Operative and Postpartum: none, VBAC Hemoglobin  Date Value Range Status  09/28/2011 12.9  12.0 - 15.0 g/dL Final     HCT  Date Value Range Status  09/28/2011 37.3  36.0 - 46.0 % Final   ROS: No N/V, bleeding, discharge, CP, SOB, Leg cramps,  Physical Exam:  General: alert, cooperative, no distress and Sleepy Lochia: appropriate Uterine Fundus: firm DVT Evaluation: No evidence of DVT seen on physical exam. No cords or calf tenderness. No significant calf/ankle edema. CVS exam: normal rate, regular rhythm, normal S1, S2, no murmurs, rubs, clicks or gallops. All extremity pulses intact. Lungs - Normal respiratory effort, chest expands symmetrically. Lungs are clear to auscultation, no crackles or wheezes.     Discharge Diagnoses: Term Pregnancy-delivered  Discharge Information: Date: 09/30/2011 Activity: pelvic rest Diet: routine Medications: Ibuprofen Condition: stable Instructions: refer to practice specific booklet Discharge to: home Follow-up Information    Follow up with [facility]. Schedule an appointment as soon as possible for a visit in 4 weeks. (for postpartum follow up)    Contact information:   [address] [place] [address] [phone]         Newborn Data: Live born female  Birth Weight: 6 lb 1.2 oz (2755 g) APGAR: 9, 9  Home with mother  Doran Heater 09/30/2011, 7:33 AM  I have seen and examined this patient and agree the above assessment. CRESENZO-DISHMAN,Timberlyn Pickford 09/30/2011 7:45 AM

## 2011-10-12 NOTE — Progress Notes (Signed)
I have seen and examined this patient and I agree with the above. Cam Hai 8:26 PM 10/12/2011

## 2011-10-13 NOTE — Discharge Summary (Signed)
Attestation of Attending Supervision of Advanced Practitioner (CNM/NP): Evaluation and management procedures were performed by the Advanced Practitioner under my supervision and collaboration.  I have reviewed the Advanced Practitioner's note and chart, and I agree with the management and plan.  HARRAWAY-SMITH, Tobechukwu Emmick 1:06 PM     

## 2012-05-23 IMAGING — US US OB NUCHAL TRANSLUCENCY 1ST GEST
1 series · 14 of 16 positions shown · non-contrast
Comparison: none

OBSTETRICAL ULTRASOUND:
 This ultrasound was performed in The [HOSPITAL], and the [facility] report will be stored to [REDACTED] PACS.  This report is also available in [HOSPITAL]?s accessANYware.

[Series 1: us ob nuchal translucency 1st gest · 14 of 16 slices shown]
[im 1/16]
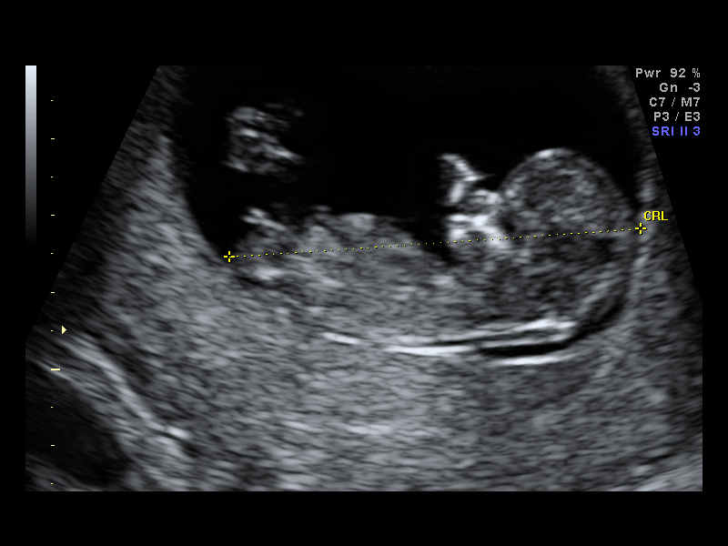
[im 2/16]
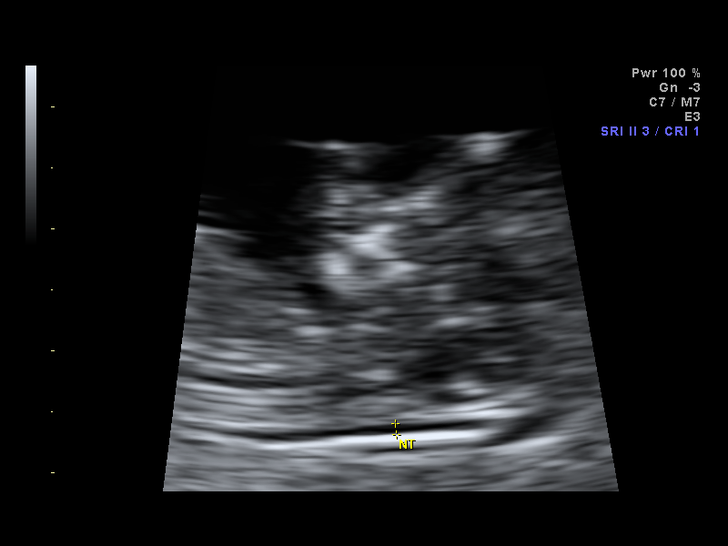
[im 3/16]
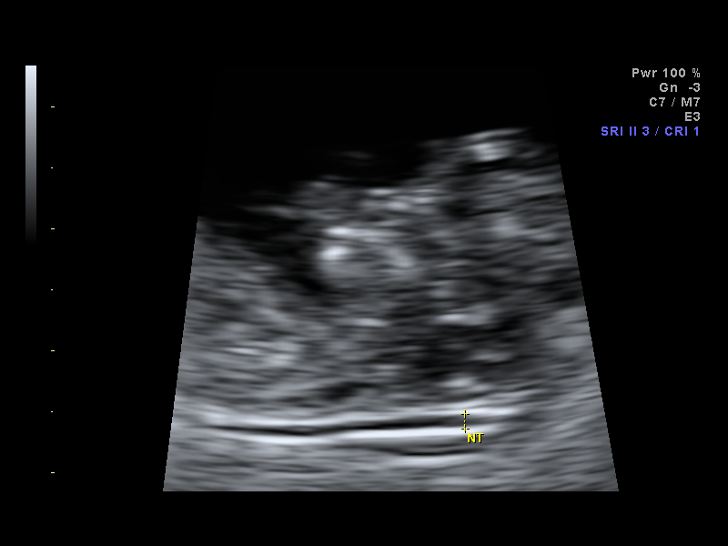
[im 5/16]
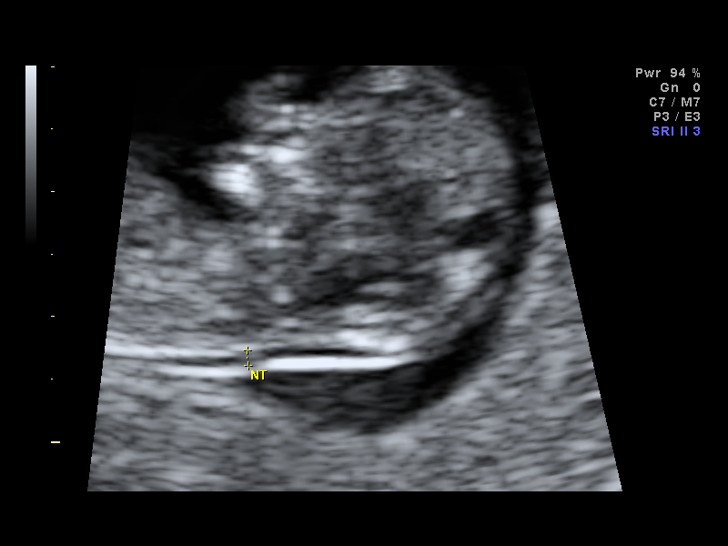
[im 6/16]
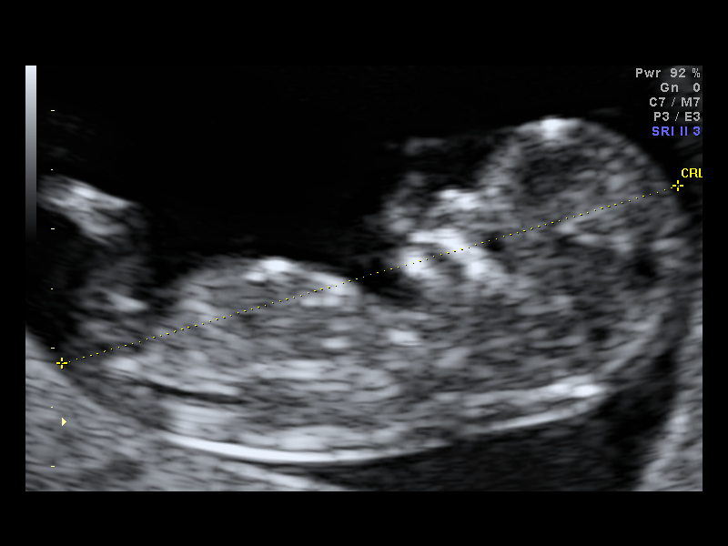
[im 7/16]
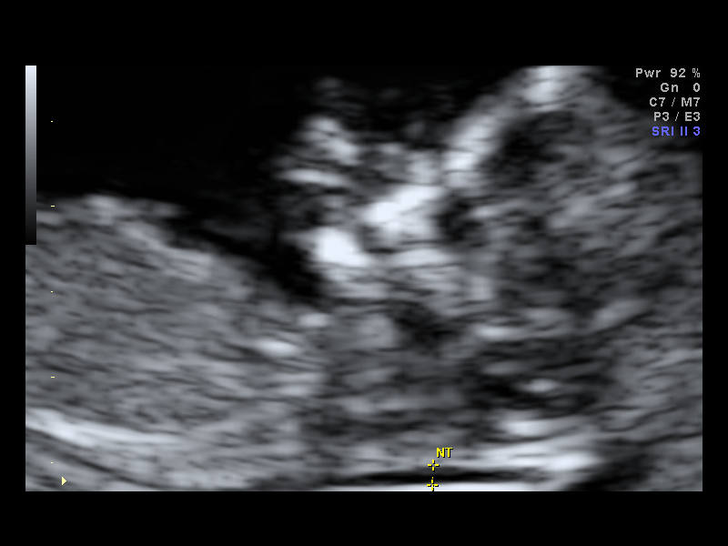
[im 8/16]
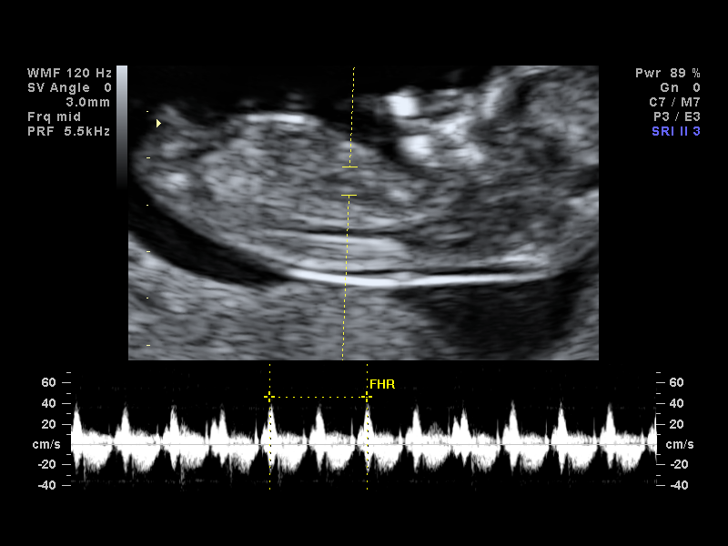
[im 9/16]
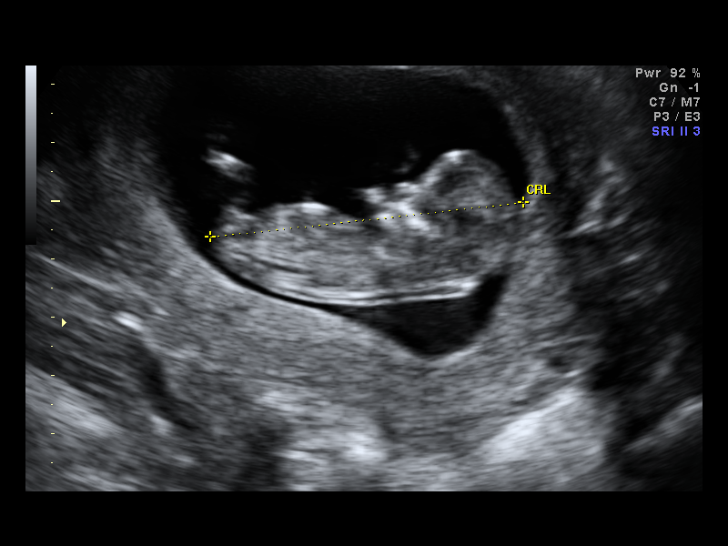
[im 10/16]
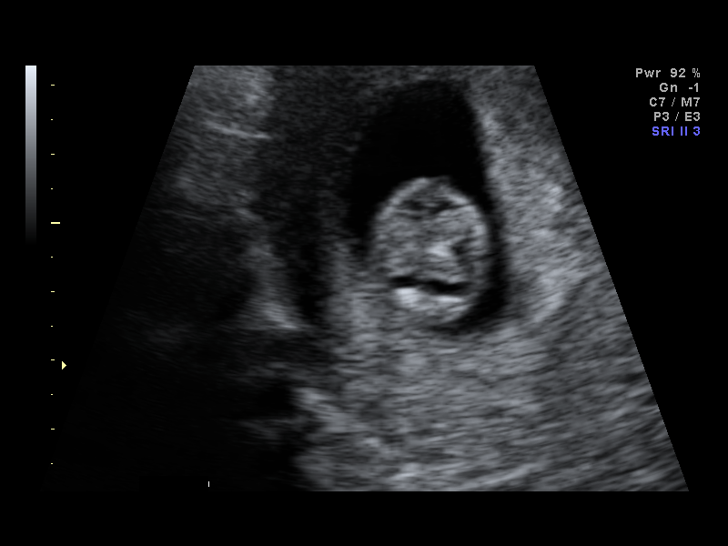
[im 11/16]
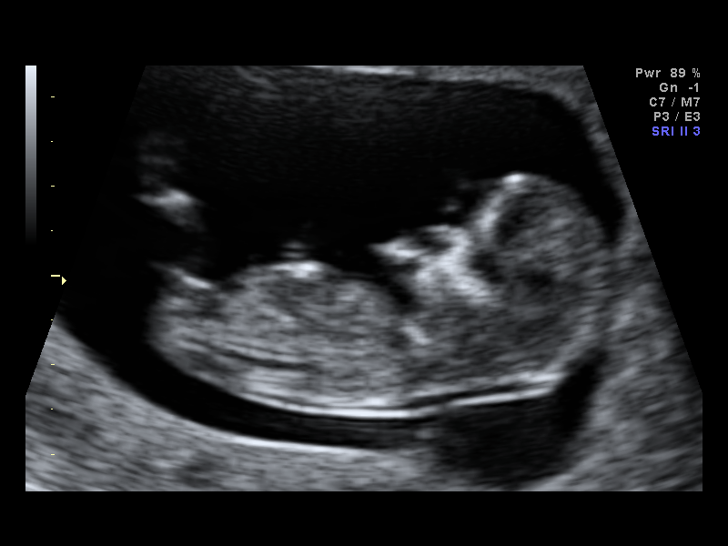
[im 13/16]
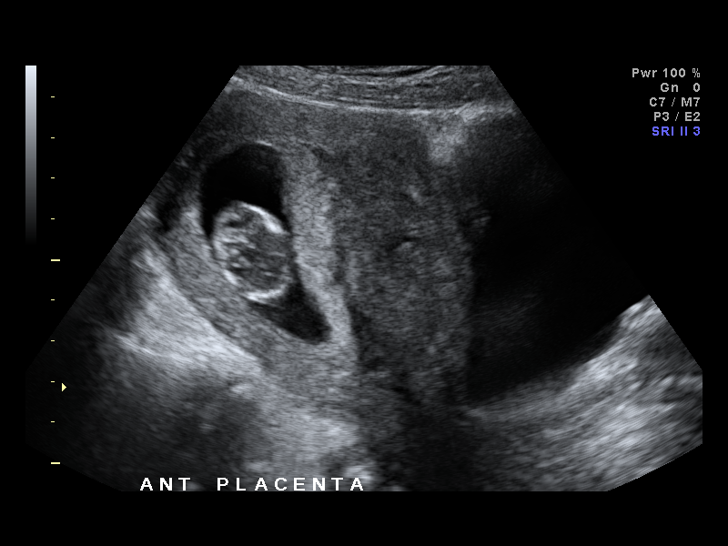
[im 14/16]
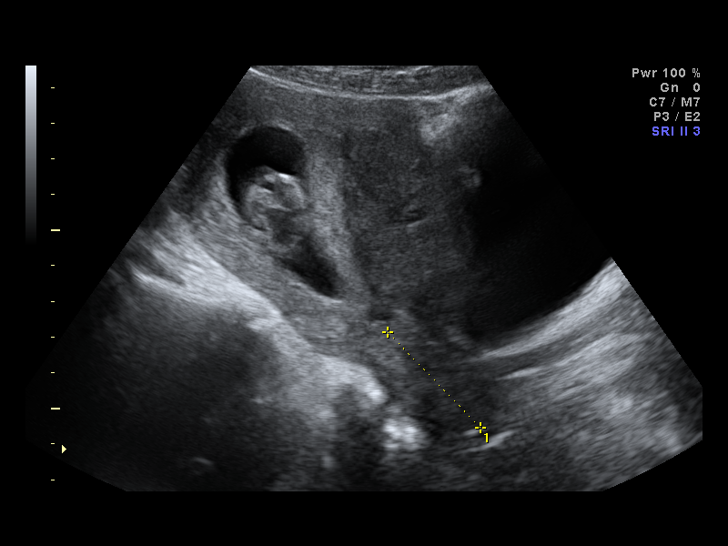
[im 15/16]
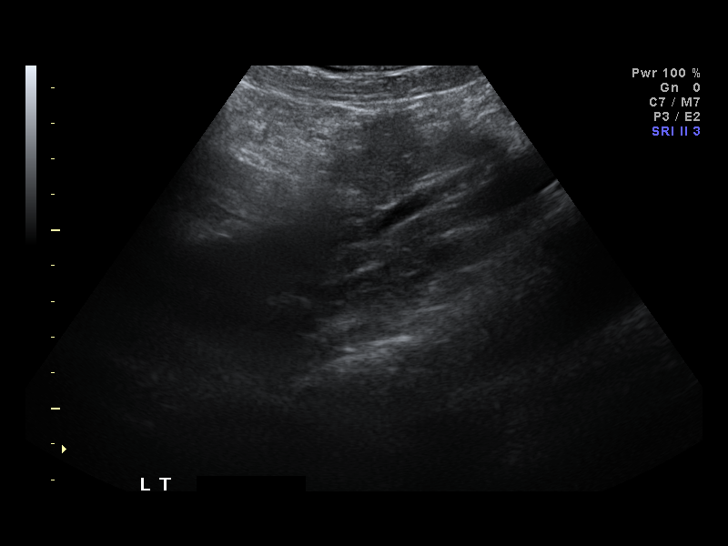
[im 16/16]
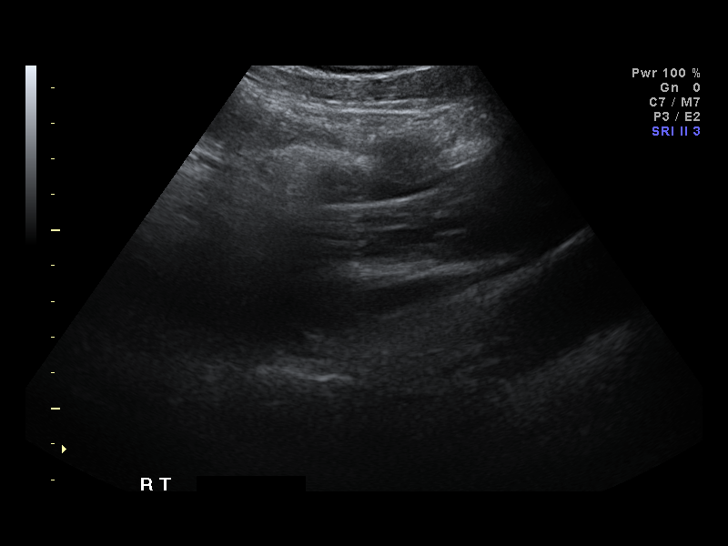

[14 of 16 positions shown; findings below may reference images not displayed]

IMPRESSION: [facility] has also been faxed to the ordering physician.

## 2013-02-01 NOTE — L&D Delivery Note (Signed)
Called to room for prolonged decel and anterior lip, FSE had previously been placed by RN. Reduced anterior lip and pt pushed effectively to birth a vigorous female per above note.  NICU was called to be in attendance of birth d/t decels, they arrived right after birth- baby was vigorous w/o complications- so not evaluated by NICU, left skin-to-skin w/ mom.  Birth under my direct supervision.  Plans bottlefeeding, interim BTL.  Cheral Marker, CNM, [facility] 10/07/2013 8:34 AM

## 2013-02-01 NOTE — L&D Delivery Note (Signed)
Delivery Note At 7:22 AM a viable and healthy female was delivered via VBAC, Spontaneous (Presentation: ; Occiput Anterior).  APGAR: 9, 9; weight .   Placenta status: Intact, Spontaneous. Loose nuchal cord. Cord: 3 vessels with the following complications: None.    Anesthesia: Epidural  Lacerations: None Est. Blood Loss (mL): 100  Mom to postpartum.  Baby to Couplet care / Skin to Skin.  Lind Covert [facility] 10/07/2013, 7:53 AM

## 2013-02-19 ENCOUNTER — Encounter: Payer: Self-pay | Admitting: *Deleted

## 2013-02-19 ENCOUNTER — Other Ambulatory Visit: Payer: Self-pay | Admitting: Obstetrics & Gynecology

## 2013-02-19 DIAGNOSIS — O3680X0 Pregnancy with inconclusive fetal viability, not applicable or unspecified: Secondary | ICD-10-CM

## 2013-02-23 ENCOUNTER — Other Ambulatory Visit: Payer: Self-pay | Admitting: Obstetrics & Gynecology

## 2013-02-23 ENCOUNTER — Ambulatory Visit (INDEPENDENT_AMBULATORY_CARE_PROVIDER_SITE_OTHER): Payer: Medicaid Other

## 2013-02-23 DIAGNOSIS — O09299 Supervision of pregnancy with other poor reproductive or obstetric history, unspecified trimester: Secondary | ICD-10-CM

## 2013-02-23 DIAGNOSIS — O481 Prolonged pregnancy: Secondary | ICD-10-CM

## 2013-02-23 DIAGNOSIS — O3680X0 Pregnancy with inconclusive fetal viability, not applicable or unspecified: Secondary | ICD-10-CM

## 2013-02-23 NOTE — Progress Notes (Signed)
U/S-single IUP with +FCA noted, FHR-159 bpm, cx appears long and closed, bilateral adnexa wnl, CRL c/w 8+0wks EDD 10/05/2013

## 2013-03-06 ENCOUNTER — Ambulatory Visit (INDEPENDENT_AMBULATORY_CARE_PROVIDER_SITE_OTHER): Payer: Medicaid Other | Admitting: Women's Health

## 2013-03-06 ENCOUNTER — Encounter: Payer: Self-pay | Admitting: Women's Health

## 2013-03-06 ENCOUNTER — Other Ambulatory Visit (HOSPITAL_COMMUNITY)
Admission: RE | Admit: 2013-03-06 | Discharge: 2013-03-06 | Disposition: A | Discharge status: Home / Self Care | Payer: Medicaid Other | Source: Ambulatory Visit | Attending: Obstetrics & Gynecology | Admitting: Obstetrics & Gynecology

## 2013-03-06 VITALS — BP 104/62 | Wt 122.5 lb

## 2013-03-06 DIAGNOSIS — Z331 Pregnant state, incidental: Secondary | ICD-10-CM

## 2013-03-06 DIAGNOSIS — O09219 Supervision of pregnancy with history of pre-term labor, unspecified trimester: Secondary | ICD-10-CM

## 2013-03-06 DIAGNOSIS — O0990 Supervision of high risk pregnancy, unspecified, unspecified trimester: Secondary | ICD-10-CM | POA: Insufficient documentation

## 2013-03-06 DIAGNOSIS — O99320 Drug use complicating pregnancy, unspecified trimester: Secondary | ICD-10-CM

## 2013-03-06 DIAGNOSIS — Z113 Encounter for screening for infections with a predominantly sexual mode of transmission: Secondary | ICD-10-CM | POA: Insufficient documentation

## 2013-03-06 DIAGNOSIS — F17200 Nicotine dependence, unspecified, uncomplicated: Secondary | ICD-10-CM

## 2013-03-06 DIAGNOSIS — Z1389 Encounter for screening for other disorder: Secondary | ICD-10-CM

## 2013-03-06 DIAGNOSIS — Z3480 Encounter for supervision of other normal pregnancy, unspecified trimester: Secondary | ICD-10-CM

## 2013-03-06 DIAGNOSIS — Z1151 Encounter for screening for human papillomavirus (HPV): Secondary | ICD-10-CM | POA: Insufficient documentation

## 2013-03-06 DIAGNOSIS — Z98891 History of uterine scar from previous surgery: Secondary | ICD-10-CM | POA: Insufficient documentation

## 2013-03-06 DIAGNOSIS — Z23 Encounter for immunization: Secondary | ICD-10-CM

## 2013-03-06 DIAGNOSIS — O99330 Smoking (tobacco) complicating pregnancy, unspecified trimester: Secondary | ICD-10-CM

## 2013-03-06 DIAGNOSIS — F1120 Opioid dependence, uncomplicated: Secondary | ICD-10-CM | POA: Insufficient documentation

## 2013-03-06 DIAGNOSIS — F1920 Other psychoactive substance dependence, uncomplicated: Secondary | ICD-10-CM

## 2013-03-06 DIAGNOSIS — Z01419 Encounter for gynecological examination (general) (routine) without abnormal findings: Secondary | ICD-10-CM | POA: Insufficient documentation

## 2013-03-06 DIAGNOSIS — O34219 Maternal care for unspecified type scar from previous cesarean delivery: Secondary | ICD-10-CM

## 2013-03-06 LAB — CBC
HEMATOCRIT: 36.4 % (ref 36.0–46.0)
Hemoglobin: 12.5 g/dL (ref 12.0–15.0)
MCH: 31.2 pg (ref 26.0–34.0)
MCHC: 34.3 g/dL (ref 30.0–36.0)
MCV: 90.8 fL (ref 78.0–100.0)
PLATELETS: 285 10*3/uL (ref 150–400)
RBC: 4.01 MIL/uL (ref 3.87–5.11)
RDW: 13.6 % (ref 11.5–15.5)
WBC: 8.7 10*3/uL (ref 4.0–10.5)

## 2013-03-06 LAB — POCT URINALYSIS DIPSTICK
Blood, UA: NEGATIVE
GLUCOSE UA: NEGATIVE
Ketones, UA: NEGATIVE
LEUKOCYTES UA: NEGATIVE
NITRITE UA: NEGATIVE
PROTEIN UA: NEGATIVE

## 2013-03-06 MED ORDER — INFLUENZA VAC SPLIT QUAD 0.5 ML IM SUSP
0.5000 mL | Freq: Once | INTRAMUSCULAR | Status: AC
  Administered 2013-03-06: 0.5 mL via INTRAMUSCULAR

## 2013-03-06 NOTE — Patient Instructions (Addendum)
[place] Pediatricians:  [facility] [phone]            [facility] [phone]                 [facility] [phone] (usually doesn't accept new patients unless you have family there already, you are always welcome to call and ask)             [facility] ([address]) [phone]   [facility] Pediatricians:   [facility]: [phone]  [facility]: [phone]   Pregnancy - First Trimester During sexual intercourse, millions of sperm go into the vagina. Only 1 sperm will penetrate and fertilize the female egg while it is in the Fallopian tube. One week later, the fertilized egg implants into the [place] of the uterus. An embryo begins to develop into a baby. At 6 to 8 weeks, the eyes and face are formed and the heartbeat can be seen on ultrasound. At the end of 12 weeks (first trimester), all the baby's organs are formed. Now that you are pregnant, you will want to do everything you can to have a healthy baby. Two of the most important things are to get good prenatal care and follow your caregiver's instructions. Prenatal care is all the medical care you receive before the baby's birth. It is given to prevent, find, and treat problems during the pregnancy and childbirth. PRENATAL EXAMS  During prenatal visits, your weight, blood pressure, and urine are checked. This is done to make sure you are healthy and progressing normally during the pregnancy.  A pregnant woman should gain 25 to 35 pounds during the pregnancy. However, if you are overweight or underweight, your caregiver will advise you regarding your weight.  Your caregiver will ask and answer questions for you.  Blood work, cervical cultures, other necessary tests, and a Pap test are done during your prenatal exams. These tests are done to check on your health and the probable health of your baby. Tests are strongly  recommended and done for HIV with your permission. This is the virus that causes AIDS. These tests are done because medicines can be given to help prevent your baby from being born with this infection should you have been infected without knowing it. Blood work is also used to find out your blood type, previous infections, and follow your blood levels (hemoglobin).  Low hemoglobin (anemia) is common during pregnancy. Iron and vitamins are given to help prevent this. Later in the pregnancy, blood tests for diabetes will be done along with any other tests if any problems develop.  You may need other tests to make sure you and the baby are doing well. CHANGES DURING THE FIRST TRIMESTER  Your body goes through many changes during pregnancy. They vary from person to person. Talk to your caregiver about changes you notice and are concerned about. Changes can include:  Your menstrual period stops.  The egg and sperm carry the genes that determine what you look like. Genes from you and your partner are forming a baby. The female genes determine whether the baby is a boy or a girl.  Your body increases in girth and you may feel bloated.  Feeling sick to your stomach (nauseous) and throwing up (vomiting). If the vomiting is uncontrollable, call your caregiver.  Your breasts will begin to enlarge and become tender.  Your nipples may stick out more and become darker.  The need to urinate  more. Painful urination may mean you have a bladder infection.  Tiring easily.  Loss of appetite.  Cravings for certain kinds of food.  At first, you may gain or lose a couple of pounds.  You may have changes in your emotions from day to day (excited to be pregnant or concerned something may go wrong with the pregnancy and baby).  You may have more vivid and strange dreams. HOME CARE INSTRUCTIONS   It is very important to avoid all smoking, alcohol and non-prescribed drugs during your pregnancy. These affect the  formation and growth of the baby. Avoid chemicals while pregnant to ensure the delivery of a healthy infant.  Start your prenatal visits by the 12th week of pregnancy. They are usually scheduled monthly at first, then more often in the last 2 months before delivery. Keep your caregiver's appointments. Follow your caregiver's instructions regarding medicine use, blood and lab tests, exercise, and diet.  During pregnancy, you are providing food for you and your baby. Eat regular, well-balanced meals. Choose foods such as meat, fish, milk and other low fat dairy products, vegetables, fruits, and whole-grain breads and cereals. Your caregiver will tell you of the ideal weight gain.  You can help morning sickness by keeping soda crackers at the bedside. Eat a couple before arising in the morning. You may want to use the crackers without salt on them.  Eating 4 to 5 small meals rather than 3 large meals a day also may help the nausea and vomiting.  Drinking liquids between meals instead of during meals also seems to help nausea and vomiting.  A physical sexual relationship may be continued throughout pregnancy if there are no other problems. Problems may be early (premature) leaking of amniotic fluid from the membranes, vaginal bleeding, or belly (abdominal) pain.  Exercise regularly if there are no restrictions. Check with your caregiver or physical therapist if you are unsure of the safety of some of your exercises. Greater weight gain will occur in the last 2 trimesters of pregnancy. Exercising will help:  Control your weight.  Keep you in shape.  Prepare you for labor and delivery.  Help you lose your pregnancy weight after you deliver your baby.  Wear a good support or jogging bra for breast tenderness during pregnancy. This may help if worn during sleep too.  Ask when prenatal classes are available. Begin classes when they are offered.  Do not use hot tubs, steam rooms, or saunas.  Wear  your seat belt when driving. This protects you and your baby if you are in an accident.  Avoid raw meat, uncooked cheese, cat litter boxes, and soil used by cats throughout the pregnancy. These carry germs that can cause birth defects in the baby.  The first trimester is a good time to visit your dentist for your dental health. Getting your teeth cleaned is okay. Use a softer toothbrush and brush gently during pregnancy.  Ask for help if you have financial, counseling, or nutritional needs during pregnancy. Your caregiver will be able to offer counseling for these needs as well as refer you for other special needs.  Do not take any medicines or herbs unless told by your caregiver.  Inform your caregiver if there is any mental or physical domestic violence.  Make a list of emergency phone numbers of family, friends, hospital, and police and fire departments.  Write down your questions. Take them to your prenatal visit.  Do not douche.  Do not cross your legs.  If you have to stand for long periods of time, rotate you feet or take small steps in a circle.  You may have more vaginal secretions that may require a sanitary pad. Do not use tampons or scented sanitary pads. MEDICINES AND DRUG USE IN PREGNANCY  Take prenatal vitamins as directed. The vitamin should contain 1 milligram of folic acid. Keep all vitamins out of reach of children. Only a couple vitamins or tablets containing iron may be fatal to a baby or young child when ingested.  Avoid use of all medicines, including herbs, over-the-counter medicines, not prescribed or suggested by your caregiver. Only take over-the-counter or prescription medicines for pain, discomfort, or fever as directed by your caregiver. Do not use aspirin, ibuprofen, or naproxen unless directed by your caregiver.  Let your caregiver also know about herbs you may be using.  Alcohol is related to a number of birth defects. This includes fetal alcohol  syndrome. All alcohol, in any form, should be avoided completely. Smoking will cause low birth rate and premature babies.  Street or illegal drugs are very harmful to the baby. They are absolutely forbidden. A baby born to an addicted mother will be addicted at birth. The baby will go through the same withdrawal an adult does.  Let your caregiver know about any medicines that you have to take and for what reason you take them. SEEK MEDICAL CARE IF:  You have any concerns or worries during your pregnancy. It is better to call with your questions if you feel they cannot wait, rather than worry about them. SEEK IMMEDIATE MEDICAL CARE IF:   An unexplained oral temperature above 102 F (38.9 C) develops, or as your caregiver suggests.  You have leaking of fluid from the vagina (birth canal). If leaking membranes are suspected, take your temperature and inform your caregiver of this when you call.  There is vaginal spotting or bleeding. Notify your caregiver of the amount and how many pads are used.  You develop a bad smelling vaginal discharge with a change in the color.  You continue to feel sick to your stomach (nauseated) and have no relief from remedies suggested. You vomit blood or coffee ground-like materials.  You lose more than 2 pounds of weight in 1 week.  You gain more than 2 pounds of weight in 1 week and you notice swelling of your face, hands, feet, or legs.  You gain 5 pounds or more in 1 week (even if you do not have swelling of your hands, face, legs, or feet).  You get exposed to [place] measles and have never had them.  You are exposed to fifth disease or chickenpox.  You develop belly (abdominal) pain. Round ligament discomfort is a common non-cancerous (benign) cause of abdominal pain in pregnancy. Your caregiver still must evaluate this.  You develop headache, fever, diarrhea, pain with urination, or shortness of breath.  You fall or are in a car accident or have any  kind of trauma.  There is mental or physical violence in your home. Document Released: 01/12/2001 Document Revised: 10/13/2011 Document Reviewed: 07/16/2008 [facility] Patient Information 2014 [place], [place].  Smoking Cessation (1-800-QUIT-NOW) Quitting smoking is important to your health and has many advantages. However, it is not always easy to quit since nicotine is a very addictive drug. Often times, people try 3 times or more before being able to quit. This document explains the best ways for you to prepare to quit smoking. Quitting takes hard work and a  lot of effort, but you can do it. ADVANTAGES OF QUITTING SMOKING  You will live longer, feel better, and live better.  Your body will feel the impact of quitting smoking almost immediately.  Within 20 minutes, blood pressure decreases. Your pulse returns to its normal level.  After 8 hours, carbon monoxide levels in the blood return to normal. Your oxygen level increases.  After 24 hours, the chance of having a heart attack starts to decrease. Your breath, hair, and body stop smelling like smoke.  After 48 hours, damaged nerve endings begin to recover. Your sense of taste and smell improve.  After 72 hours, the body is virtually free of nicotine. Your bronchial tubes relax and breathing becomes easier.  After 2 to 12 weeks, lungs can hold more air. Exercise becomes easier and circulation improves.  The risk of having a heart attack, stroke, cancer, or lung disease is greatly reduced.  After 1 year, the risk of coronary heart disease is cut in half.  After 5 years, the risk of stroke falls to the same as a nonsmoker.  After 10 years, the risk of lung cancer is cut in half and the risk of other cancers decreases significantly.  After 15 years, the risk of coronary heart disease drops, usually to the level of a nonsmoker.  If you are pregnant, quitting smoking will improve your chances of having a healthy baby.  The people you  live with, especially any children, will be healthier.  You will have extra money to spend on things other than cigarettes. QUESTIONS TO THINK ABOUT BEFORE ATTEMPTING TO QUIT You may want to talk about your answers with your caregiver.  Why do you want to quit?  If you tried to quit in the past, what helped and what did not?  What will be the most difficult situations for you after you quit? How will you plan to handle them?  Who can help you through the tough times? Your family? Friends? A caregiver?  What pleasures do you get from smoking? What ways can you still get pleasure if you quit? Here are some questions to ask your caregiver:  How can you help me to be successful at quitting?  What medicine do you think would be best for me and how should I take it?  What should I do if I need more help?  What is smoking withdrawal like? How can I get information on withdrawal? GET READY  Set a quit date.  Change your environment by getting rid of all cigarettes, ashtrays, matches, and lighters in your home, car, or work. Do not let people smoke in your home.  Review your past attempts to quit. Think about what worked and what did not. GET SUPPORT AND ENCOURAGEMENT You have a better chance of being successful if you have help. You can get support in many ways.  Tell your family, friends, and co-workers that you are going to quit and need their support. Ask them not to smoke around you.  Get individual, group, or telephone counseling and support. Programs are available at [employer] and health centers. Call your local health department for information about programs in your area.  Spiritual beliefs and practices may help some smokers quit.  Download a "quit meter" on your computer to keep track of quit statistics, such as how long you have gone without smoking, cigarettes not smoked, and money saved.  Get a self-help book about quitting smoking and staying off of  tobacco. LEARN  NEW SKILLS AND BEHAVIORS  Distract yourself from urges to smoke. Talk to someone, go for a walk, or occupy your time with a task.  Change your normal routine. Take a different route to work. Drink tea instead of coffee. Eat breakfast in a different place.  Reduce your stress. Take a hot bath, exercise, or read a book.  Plan something enjoyable to do every day. Reward yourself for not smoking.  Explore interactive web-based programs that specialize in helping you quit. GET MEDICINE AND USE IT CORRECTLY Medicines can help you stop smoking and decrease the urge to smoke. Combining medicine with the above behavioral methods and support can greatly increase your chances of successfully quitting smoking.  Nicotine replacement therapy helps deliver nicotine to your body without the negative effects and risks of smoking. Nicotine replacement therapy includes nicotine gum, lozenges, inhalers, nasal sprays, and skin patches. Some may be available over-the-counter and others require a prescription.  Antidepressant medicine helps people abstain from smoking, but how this works is unknown. This medicine is available by prescription.  Nicotinic receptor partial agonist medicine simulates the effect of nicotine in your brain. This medicine is available by prescription. Ask your caregiver for advice about which medicines to use and how to use them based on your health history. Your caregiver will tell you what side effects to look out for if you choose to be on a medicine or therapy. Carefully read the information on the package. Do not use any other product containing nicotine while using a nicotine replacement product.  RELAPSE OR DIFFICULT SITUATIONS Most relapses occur within the first 3 months after quitting. Do not be discouraged if you start smoking again. Remember, most people try several times before finally quitting. You may have symptoms of withdrawal because your body is used to  nicotine. You may crave cigarettes, be irritable, feel very hungry, cough often, get headaches, or have difficulty concentrating. The withdrawal symptoms are only temporary. They are strongest when you first quit, but they will go away within 10 14 days. To reduce the chances of relapse, try to:  Avoid drinking alcohol. Drinking lowers your chances of successfully quitting.  Reduce the amount of caffeine you consume. Once you quit smoking, the amount of caffeine in your body increases and can give you symptoms, such as a rapid heartbeat, sweating, and anxiety.  Avoid smokers because they can make you want to smoke.  Do not let weight gain distract you. Many smokers will gain weight when they quit, usually less than 10 pounds. Eat a healthy diet and stay active. You can always lose the weight gained after you quit.  Find ways to improve your mood other than smoking. FOR MORE INFORMATION  www.smokefree.gov  Document Released: 01/12/2001 Document Revised: 07/20/2011 Document Reviewed: 04/29/2011 [facility] Patient Information 2014 [place], [place].   Pregnancy and Smoking Smoking during pregnancy is very unhealthy for the mother and the developing fetus. The addictive drug in cigarettes (nicotine), carbon monoxide, and many other poisons are inhaled from a cigarette and are carried through your bloodstream to your fetus. Cigarette smoke contains more than 2,500 chemicals. It is not known which of these chemicals are harmful to the developing fetus. However, both nicotine and carbon monoxide play a role in causing health problems in pregnancy. Effects on the fetus of smoking during pregnancy:  Decrease in blood flow and oxygen to the uterus, placenta, and your fetus.  Increased heart rate of the fetus.  Slowing of your fetus's growth in the uterus (  intrauterine growth retardation).  Placental problems. Placenta may partially cover or completely cover the opening to the cervix (placenta previa),  or the placenta may partially or completely separate from the uterus (placental abruption).  Increase risk of pregnancy outside of the uterus (tubal pregnancy).  Premature rupture membranes, causing the sac that holds the fetus to break too early, resulting in premature birth and increased health risks to the newborn.  Increased risk of birth defects, including heart defects.  Increased risk of miscarriage. Newborns born to women who smoke during pregnancy:  Are more likely to be born too early (prematurely).  Are more likely to be at a low birth weight.  Are at risk for serious health problems, chronic or lifelong disabilities (cerebral palsy, mental retardation, learning problems), and possibly even death  Are at risk of Sudden Infant Death Syndrome (SIDS).  Have higher rates of miscarriage and stillbirth.  Have more lung and breathing (respiratory) problems. Long-term effects on a child's behavior: Some of the following trends are seen with children of smoking mothers:  Increased risk for drug abuse, behavior, and conduct disorders.  Increased risk for smoking in adolescent girls.  Increased risk for negative behavior in 2-year-olds.  Increase risk for asthma, colic, and childhood obesity, which can lead to diabetes.  Increased risk for finger and toe disorders. Resources to stop smoking during pregnancy:  Counseling.  Psychological treatment.  Acupuncture.  Family intervention.  Hypnosis.  Medicines that are safe to take during pregnancy. Nicotine supplements have not been studied enough. They should only be considered when all other methods fail.  Telephone QUIT lines. Smoking and Breastfeeding:  Nicotine gets passed to the infant through a mother's breastmilk. This can cause nausea, colic, cramping, and diarrhea in the infant.  Smoking may reduce milk supply and interfere with the let-down response.  Even formula-fed infants of mothers who smoke have  nicotine and cotinine (nicotine by-product) in their urine. Other resources to help stop smoking:  American Cancer Society: www.cancer.org  American Heart Association: www.americanheart.org  National Cancer Institute: www.cancer.gov  Smoke Free Families: www.smokefreefamilies.[address] [place]): 515-383-4510 START Document Released: 06/01/2004 Document Revised: 04/12/2011 Document Reviewed: 10/30/2008 [facility] Patient Information 2014 [place], [place].

## 2013-03-06 NOTE — Progress Notes (Signed)
Subjective:    [name] [name] is a 33 y.o. [id number] Caucasian female at 5081w4d by 8wk u/s, being seen today for her first obstetrical visit.  Her obstetrical history is significant for smoker and subutex therapy, h/o c/s d/t abruption and face presentation w/ subsequent successful vbac, h/o PTB @ 30wks- twins d/t PPROM.  On subutex 8mg  SL daily x631yrs d/t h/o lortab addiction, [facility]uss [place] [facility] [place]. Pregnancy history fully reviewed.   Patient reports no complaints. Denies vb, cramping, uti s/s, abnormal/malodorous vag d/c, or vulvovaginal itching/irritation.  Filed Vitals:   03/06/13 1001  BP: 104/62  Weight: 122 lb 8 oz (55.566 kg)    HISTORY: OB History  Gravida Para Term Preterm AB SAB TAB Ectopic Multiple Living  5 4 3 1     1 5     # Outcome Date GA Lbr Len/2nd Weight Sex Delivery Anes PTL Lv  5 CUR           4 TRM 09/28/11 259w6d 09:00 / 03:33 6 lb 1.2 oz (2.755 kg) M VBAC EPI  Y  3 TRM 2012 6573w0d    LTCS  N Y  2A PRE 2005 6439w0d         2B  2005 6639w0d         1 TRM 2003 8046w0d   M         Past Medical History  Diagnosis Date  . Pregnant 06/04/11    expected delivery date: 10/13/11   History reviewed. No pertinent past surgical history. Family History  Problem Relation Age of Onset  . Cancer Other   . Hyperlipidemia Other   . Hypertension Other   . Diabetes Mother   . Hypertension Mother   . Asthma Brother      Exam    Pelvic Exam:    Perineum: Normal Perineum   Vulva: normal   Vagina:  normal mucosa, normal discharge, no palpable nodules   Uterus Normal size/shape/contour for GA     Cervix: normal   Adnexa: Not palpable   Urinary: urethral meatus normal    System:     Skin: normal coloration and turgor, no rashes    Neurologic: oriented, normal mood   Extremities: normal strength, tone, and muscle mass   HEENT PERRLA   Mouth/Teeth mucous membranes moist   Cardiovascular: regular rate and rhythm   Respiratory:  appears well, vitals normal, no respiratory distress, acyanotic, normal RR   Abdomen: soft, non-tender    Thin prep pap smear obtained w/ high risk HPV cotesting FHR: 161 via informal transabdominal u/s   Assessment:    Pregnancy: [id number] Patient Active Problem List   Diagnosis Date Noted  . Supervision of high-risk pregnancy 03/06/2013    Priority: High  . Vaginal delivery following previous caesarean section 09/29/2011    6681w4d [id number] New OB visit Smoker H/O prev c/s w/ subsequent VBAC H/O PTB @ 30wks, twins d/t PPROM Subutex therapy d/t h/o lortab addiction    Plan:   Initial labs drawn Continue prenatal vitamins Problem list reviewed and updated Reviewed n/v relief measures and warning s/s to report Reviewed recommended weight gain based on pre-gravid BMI Encouraged well-balanced diet Genetic Screening discussed Integrated Screen: requested Cystic fibrosis screening discussed requested Ultrasound discussed; fetal survey: requested Follow up in 3 weeks for 1st IT/NT and visit w/ MD Encouraged smoking cessation, info given Desires another VBAC, consent signed and to be scanned in   Marge DuncansBooker, Fines Kimberlin Randall 03/06/2013  10:31 AM

## 2013-03-07 LAB — URINALYSIS
Bilirubin Urine: NEGATIVE
Glucose, UA: NEGATIVE mg/dL
Hgb urine dipstick: NEGATIVE
Ketones, ur: NEGATIVE mg/dL
LEUKOCYTES UA: NEGATIVE
NITRITE: NEGATIVE
PH: 8 (ref 5.0–8.0)
Protein, ur: NEGATIVE mg/dL
SPECIFIC GRAVITY, URINE: 1.013 (ref 1.005–1.030)
UROBILINOGEN UA: 0.2 mg/dL (ref 0.0–1.0)

## 2013-03-07 LAB — DRUG SCREEN, URINE, NO CONFIRMATION
Amphetamine Screen, Ur: NEGATIVE
BENZODIAZEPINES.: NEGATIVE
Barbiturate Quant, Ur: NEGATIVE
COCAINE METABOLITES: NEGATIVE
CREATININE, U: 71 mg/dL
Marijuana Metabolite: NEGATIVE
Methadone: NEGATIVE
OPIATE SCREEN, URINE: NEGATIVE
PHENCYCLIDINE (PCP): NEGATIVE
PROPOXYPHENE: NEGATIVE

## 2013-03-07 LAB — URINE CULTURE
Colony Count: NO GROWTH
ORGANISM ID, BACTERIA: NO GROWTH

## 2013-03-07 LAB — RUBELLA SCREEN: RUBELLA: 9.72 {index} — AB (ref <0.90)

## 2013-03-07 LAB — ANTIBODY SCREEN: Antibody Screen: NEGATIVE

## 2013-03-07 LAB — HEPATITIS B SURFACE ANTIGEN: Hepatitis B Surface Ag: NEGATIVE

## 2013-03-07 LAB — ABO AND RH: Rh Type: POSITIVE

## 2013-03-07 LAB — VARICELLA ZOSTER ANTIBODY, IGG: VARICELLA IGG: 1076 {index} — AB (ref <135.00)

## 2013-03-07 LAB — OXYCODONE SCREEN, UA, RFLX CONFIRM: OXYCODONE SCRN UR: NEGATIVE ng/mL

## 2013-03-07 LAB — HIV ANTIBODY (ROUTINE TESTING W REFLEX): HIV: NONREACTIVE

## 2013-03-07 LAB — RPR

## 2013-03-08 ENCOUNTER — Encounter: Payer: Self-pay | Admitting: Women's Health

## 2013-03-28 ENCOUNTER — Other Ambulatory Visit: Payer: Medicaid Other

## 2013-03-28 ENCOUNTER — Encounter: Payer: Medicaid Other | Admitting: Obstetrics and Gynecology

## 2013-04-03 ENCOUNTER — Encounter: Payer: Self-pay | Admitting: Obstetrics & Gynecology

## 2013-04-03 ENCOUNTER — Ambulatory Visit (INDEPENDENT_AMBULATORY_CARE_PROVIDER_SITE_OTHER): Payer: Medicaid Other | Admitting: Obstetrics & Gynecology

## 2013-04-03 ENCOUNTER — Ambulatory Visit (INDEPENDENT_AMBULATORY_CARE_PROVIDER_SITE_OTHER): Payer: Medicaid Other

## 2013-04-03 ENCOUNTER — Other Ambulatory Visit: Payer: Self-pay | Admitting: Women's Health

## 2013-04-03 ENCOUNTER — Other Ambulatory Visit: Payer: Self-pay | Admitting: Obstetrics & Gynecology

## 2013-04-03 VITALS — BP 80/60 | Wt 121.0 lb

## 2013-04-03 DIAGNOSIS — Z1389 Encounter for screening for other disorder: Secondary | ICD-10-CM

## 2013-04-03 DIAGNOSIS — O34219 Maternal care for unspecified type scar from previous cesarean delivery: Secondary | ICD-10-CM

## 2013-04-03 DIAGNOSIS — Z3480 Encounter for supervision of other normal pregnancy, unspecified trimester: Secondary | ICD-10-CM

## 2013-04-03 DIAGNOSIS — Z36 Encounter for antenatal screening of mother: Secondary | ICD-10-CM

## 2013-04-03 DIAGNOSIS — O09219 Supervision of pregnancy with history of pre-term labor, unspecified trimester: Secondary | ICD-10-CM

## 2013-04-03 DIAGNOSIS — O99330 Smoking (tobacco) complicating pregnancy, unspecified trimester: Secondary | ICD-10-CM

## 2013-04-03 DIAGNOSIS — Z331 Pregnant state, incidental: Secondary | ICD-10-CM

## 2013-04-03 DIAGNOSIS — O0990 Supervision of high risk pregnancy, unspecified, unspecified trimester: Secondary | ICD-10-CM

## 2013-04-03 LAB — POCT URINALYSIS DIPSTICK
Blood, UA: 1
Glucose, UA: NEGATIVE
KETONES UA: NEGATIVE
LEUKOCYTES UA: NEGATIVE
NITRITE UA: NEGATIVE
PROTEIN UA: NEGATIVE

## 2013-04-03 NOTE — Progress Notes (Signed)
Sonogram is normal see report 1st IT today Follow up in 4 weeks  No bleeding

## 2013-04-03 NOTE — Progress Notes (Signed)
U/S(13+4wks)-single IUP with +FCA noted, FHR-155bpm, cx appears closed (3.5cm), fluid wnl SDP-3.4cm, CRL c/w LMP dates, NB present, NT-1.5394mm, anterior Gr 0 placenta, bilateral adnexa appears wnl

## 2013-04-03 NOTE — Addendum Note (Signed)
Addended by: Richardson ChiquitoRAVIS, Claribel Sachs M on: 04/03/2013 04:44 PM   Modules accepted: Orders

## 2013-04-07 LAB — MATERNAL SCREEN, INTEGRATED #1

## 2013-05-01 ENCOUNTER — Ambulatory Visit (INDEPENDENT_AMBULATORY_CARE_PROVIDER_SITE_OTHER): Payer: Medicaid Other | Admitting: Advanced Practice Midwife

## 2013-05-01 ENCOUNTER — Encounter: Payer: Self-pay | Admitting: Advanced Practice Midwife

## 2013-05-01 VITALS — BP 102/58 | Wt 122.0 lb

## 2013-05-01 DIAGNOSIS — Z3480 Encounter for supervision of other normal pregnancy, unspecified trimester: Secondary | ICD-10-CM

## 2013-05-01 DIAGNOSIS — O99330 Smoking (tobacco) complicating pregnancy, unspecified trimester: Secondary | ICD-10-CM

## 2013-05-01 DIAGNOSIS — Z1389 Encounter for screening for other disorder: Secondary | ICD-10-CM

## 2013-05-01 DIAGNOSIS — Z331 Pregnant state, incidental: Secondary | ICD-10-CM

## 2013-05-01 DIAGNOSIS — O34219 Maternal care for unspecified type scar from previous cesarean delivery: Secondary | ICD-10-CM

## 2013-05-01 DIAGNOSIS — O09219 Supervision of pregnancy with history of pre-term labor, unspecified trimester: Secondary | ICD-10-CM

## 2013-05-01 LAB — POCT URINALYSIS DIPSTICK
GLUCOSE UA: NEGATIVE
Ketones, UA: NEGATIVE
Leukocytes, UA: NEGATIVE
NITRITE UA: NEGATIVE
Protein, UA: NEGATIVE
RBC UA: NEGATIVE

## 2013-05-01 NOTE — Progress Notes (Signed)
2nd IT today.    No c/o at this time.  Routine questions about pregnancy answered.  F/U in 2 weeks for anatomy scan.

## 2013-05-08 LAB — MATERNAL SCREEN, INTEGRATED #2
AFP MoM: 1.81
AFP, SERUM MAT SCREEN: 82.4 ng/mL
Age risk Down Syndrome: 1:590 {titer}
CALCULATED GESTATIONAL AGE MAT SCREEN: 17.4
CROWN RUMP LENGTH MAT SCREEN 2: 76.3 mm
Estriol Mom: 0.97
Estriol, Free: 1.25 ng/mL
HCG, SERUM MAT SCREEN: 14.9 [IU]/mL
INHIBIN A DIMERIC MAT SCREEN: 446 pg/mL
INHIBIN A MOM MAT SCREEN: 2.42
MSS Down Syndrome: <1:5000 {titer}
NT MOM MAT SCREEN: 1.14
Nuchal Translucency: 1.91 mm
Number of fetuses: 1
PAPP-A MOM MAT SCREEN: 0.38
PAPP-A: 637 ng/mL
hCG MoM: 0.49

## 2013-05-10 ENCOUNTER — Telehealth: Payer: Self-pay | Admitting: Obstetrics and Gynecology

## 2013-05-10 NOTE — Telephone Encounter (Signed)
Pt c/o vaginal itching and burning, has tried monistat with no improvement. Call transferred to front staff for an appt to be made for tomorrow.

## 2013-05-11 ENCOUNTER — Encounter: Payer: Medicaid Other | Admitting: Obstetrics & Gynecology

## 2013-05-15 ENCOUNTER — Encounter: Payer: Medicaid Other | Admitting: Advanced Practice Midwife

## 2013-05-15 ENCOUNTER — Ambulatory Visit: Payer: Medicaid Other

## 2013-05-16 ENCOUNTER — Other Ambulatory Visit: Payer: Self-pay | Admitting: Advanced Practice Midwife

## 2013-05-16 ENCOUNTER — Ambulatory Visit (INDEPENDENT_AMBULATORY_CARE_PROVIDER_SITE_OTHER): Payer: Medicaid Other | Admitting: Advanced Practice Midwife

## 2013-05-16 ENCOUNTER — Ambulatory Visit (INDEPENDENT_AMBULATORY_CARE_PROVIDER_SITE_OTHER): Payer: Medicaid Other

## 2013-05-16 VITALS — BP 104/62 | Wt 120.0 lb

## 2013-05-16 DIAGNOSIS — F1920 Other psychoactive substance dependence, uncomplicated: Secondary | ICD-10-CM

## 2013-05-16 DIAGNOSIS — A599 Trichomoniasis, unspecified: Secondary | ICD-10-CM | POA: Insufficient documentation

## 2013-05-16 DIAGNOSIS — Z1389 Encounter for screening for other disorder: Secondary | ICD-10-CM

## 2013-05-16 DIAGNOSIS — O09299 Supervision of pregnancy with other poor reproductive or obstetric history, unspecified trimester: Secondary | ICD-10-CM

## 2013-05-16 DIAGNOSIS — O99330 Smoking (tobacco) complicating pregnancy, unspecified trimester: Secondary | ICD-10-CM

## 2013-05-16 DIAGNOSIS — O09219 Supervision of pregnancy with history of pre-term labor, unspecified trimester: Secondary | ICD-10-CM

## 2013-05-16 DIAGNOSIS — F1120 Opioid dependence, uncomplicated: Secondary | ICD-10-CM

## 2013-05-16 DIAGNOSIS — O99320 Drug use complicating pregnancy, unspecified trimester: Secondary | ICD-10-CM

## 2013-05-16 DIAGNOSIS — O0990 Supervision of high risk pregnancy, unspecified, unspecified trimester: Secondary | ICD-10-CM

## 2013-05-16 DIAGNOSIS — Z331 Pregnant state, incidental: Secondary | ICD-10-CM

## 2013-05-16 DIAGNOSIS — O2390 Unspecified genitourinary tract infection in pregnancy, unspecified trimester: Secondary | ICD-10-CM

## 2013-05-16 DIAGNOSIS — O34219 Maternal care for unspecified type scar from previous cesarean delivery: Secondary | ICD-10-CM

## 2013-05-16 LAB — POCT URINALYSIS DIPSTICK
Blood, UA: NEGATIVE
GLUCOSE UA: NEGATIVE
Ketones, UA: NEGATIVE
Leukocytes, UA: NEGATIVE
NITRITE UA: NEGATIVE
PROTEIN UA: NEGATIVE

## 2013-05-16 MED ORDER — METRONIDAZOLE 500 MG PO TABS: 2000.0000 mg | ORAL_TABLET | Freq: Once | ORAL | Status: DC

## 2013-05-16 NOTE — Progress Notes (Signed)
Had normal anatomy scan today.  C/O vaginal irritation X 1 week. Tried OTC yeast medicine with small improvement.  SSE:  Yellow frothy discharge; wet prep +trich.  Flagyl 2gm for pt and partner .  F/U 2 weeks POC

## 2013-05-16 NOTE — Progress Notes (Signed)
U/S(19+5wks)-active fetus, meas c/w dates, fluid wnl anterior Gr 0 palcenta, cx appears closed (3.3cm), FHR-135 bpm, female fetus, no major abnl noted

## 2013-05-16 NOTE — Patient Instructions (Signed)
Trichomoniasis °Trichomoniasis is an infection, caused by the Trichomonas organism, that affects both women and men. In women, the outer female genitalia and the vagina are affected. In men, the penis is mainly affected, but the prostate and other reproductive organs can also be involved. Trichomoniasis is a sexually transmitted disease (STD) and is most often passed to another person through sexual contact. The majority of people who get trichomoniasis do so from a sexual encounter and are also at risk for other STDs. °CAUSES  °· Sexual intercourse with an infected partner. °· It can be present in swimming pools or hot tubs. °SYMPTOMS  °· Abnormal gray-green frothy vaginal discharge in women. °· Vaginal itching and irritation in women. °· Itching and irritation of the area outside the vagina in women. °· Penile discharge with or without pain in males. °· Inflammation of the urethra (urethritis), causing painful urination. °· Bleeding after sexual intercourse. °RELATED COMPLICATIONS °· Pelvic inflammatory disease. °· Infection of the uterus (endometritis). °· Infertility. °· Tubal (ectopic) pregnancy. °· It can be associated with other STDs, including gonorrhea and chlamydia, hepatitis B, and HIV. °COMPLICATIONS DURING PREGNANCY °· Early (premature) delivery. °· Premature rupture of the membranes (PROM). °· Low birth weight. °DIAGNOSIS  °· Visualization of Trichomonas under the microscope from the vagina discharge. °· Ph of the vagina greater than 4.5, tested with a test tape. °· Trich Rapid Test. °· Culture of the organism, but this is not usually needed. °· It may be found on a Pap test. °· Having a "strawberry cervix,"which means the cervix looks very red like a strawberry. °TREATMENT  °· You may be given medication to fight the infection. Inform your caregiver if you could be or are pregnant. Some medications used to treat the infection should not be taken during pregnancy. °· Over-the-counter medications or  creams to decrease itching or irritation may be recommended. °· Your sexual partner will need to be treated if infected. °HOME CARE INSTRUCTIONS  °· Take all medication prescribed by your caregiver. °· Take over-the-counter medication for itching or irritation as directed by your caregiver. °· Do not have sexual intercourse while you have the infection. °· Do not douche or wear tampons. °· Discuss your infection with your partner, as your partner may have acquired the infection from you. Or, your partner may have been the person who transmitted the infection to you. °· Have your sex partner examined and treated if necessary. °· Practice safe, informed, and protected sex. °· See your caregiver for other STD testing. °SEEK MEDICAL CARE IF:  °· You still have symptoms after you finish the medication. °· You have an oral temperature above 102° F (38.9° C). °· You develop belly (abdominal) pain. °· You have pain when you urinate. °· You have bleeding after sexual intercourse. °· You develop a rash. °· The medication makes you sick or makes you throw up (vomit). °Document Released: 07/14/2000 Document Revised: 04/12/2011 Document Reviewed: 08/09/2008 °ExitCare® Patient Information ©2014 [employer]. ° °

## 2013-05-30 ENCOUNTER — Encounter: Payer: Self-pay | Admitting: Women's Health

## 2013-07-10 ENCOUNTER — Ambulatory Visit (INDEPENDENT_AMBULATORY_CARE_PROVIDER_SITE_OTHER): Payer: Medicaid Other | Admitting: Obstetrics & Gynecology

## 2013-07-10 ENCOUNTER — Encounter: Payer: Self-pay | Admitting: Obstetrics & Gynecology

## 2013-07-10 VITALS — BP 100/50 | Wt 118.0 lb

## 2013-07-10 DIAGNOSIS — O09899 Supervision of other high risk pregnancies, unspecified trimester: Secondary | ICD-10-CM

## 2013-07-10 DIAGNOSIS — O09219 Supervision of pregnancy with history of pre-term labor, unspecified trimester: Secondary | ICD-10-CM

## 2013-07-10 DIAGNOSIS — Z1389 Encounter for screening for other disorder: Secondary | ICD-10-CM

## 2013-07-10 DIAGNOSIS — Z98891 History of uterine scar from previous surgery: Secondary | ICD-10-CM

## 2013-07-10 DIAGNOSIS — O99320 Drug use complicating pregnancy, unspecified trimester: Secondary | ICD-10-CM

## 2013-07-10 DIAGNOSIS — Z331 Pregnant state, incidental: Secondary | ICD-10-CM

## 2013-07-10 DIAGNOSIS — F1120 Opioid dependence, uncomplicated: Secondary | ICD-10-CM

## 2013-07-10 DIAGNOSIS — F1920 Other psychoactive substance dependence, uncomplicated: Secondary | ICD-10-CM

## 2013-07-10 LAB — POCT URINALYSIS DIPSTICK
Blood, UA: NEGATIVE
Glucose, UA: NEGATIVE
Ketones, UA: NEGATIVE
Leukocytes, UA: NEGATIVE
NITRITE UA: NEGATIVE
PROTEIN UA: NEGATIVE

## 2013-07-10 NOTE — Progress Notes (Signed)
Pt has not been seen since April   High Risk Pregnancy Diagnosis(es):   Narcotic management on subutex, previous Caesarean section, previous preterm delivery  [id number] [redacted]w[redacted]d Estimated Date of Delivery: 10/05/13  Blood pressure 100/50, weight 118 lb (53.524 kg).  Urinalysis: Negative   HPI:  Pt has had baby sitter issues, knows she needs to come in next week for her PN2 Being maintained on subutex 8 BID, wants another VBAC No other problems or complaints today   BP weight and urine results all reviewed and noted. Patient reports good fetal movement, denies any bleeding and no rupture of membranes symptoms or regular contractions.  Fundal Height:  26  Fetal Heart rate:  145 Edema:  none  Patient is without complaints. All questions were answered.  Assessment:  [redacted]w[redacted]d with multiple high risk factors listed above  Medication(s) Plans:  Continue current subutex regimen  Treatment Plan:  PN2 next week  Follow up in 1 weeks for OB appt

## 2013-07-19 ENCOUNTER — Encounter: Payer: Medicaid Other | Admitting: Adult Health

## 2013-07-19 ENCOUNTER — Other Ambulatory Visit: Payer: Medicaid Other

## 2013-08-08 ENCOUNTER — Encounter: Payer: Medicaid Other | Admitting: Obstetrics and Gynecology

## 2013-08-08 ENCOUNTER — Other Ambulatory Visit: Payer: Medicaid Other

## 2013-08-22 ENCOUNTER — Encounter: Payer: Medicaid Other | Admitting: Obstetrics and Gynecology

## 2013-08-22 ENCOUNTER — Encounter: Payer: Self-pay | Admitting: Obstetrics and Gynecology

## 2013-08-22 ENCOUNTER — Ambulatory Visit (INDEPENDENT_AMBULATORY_CARE_PROVIDER_SITE_OTHER): Payer: Medicaid Other | Admitting: Obstetrics and Gynecology

## 2013-08-22 VITALS — BP 100/60 | Wt 120.8 lb

## 2013-08-22 DIAGNOSIS — Z1389 Encounter for screening for other disorder: Secondary | ICD-10-CM

## 2013-08-22 DIAGNOSIS — F1920 Other psychoactive substance dependence, uncomplicated: Secondary | ICD-10-CM

## 2013-08-22 DIAGNOSIS — O99320 Drug use complicating pregnancy, unspecified trimester: Secondary | ICD-10-CM

## 2013-08-22 DIAGNOSIS — O99330 Smoking (tobacco) complicating pregnancy, unspecified trimester: Secondary | ICD-10-CM

## 2013-08-22 DIAGNOSIS — O09899 Supervision of other high risk pregnancies, unspecified trimester: Secondary | ICD-10-CM

## 2013-08-22 DIAGNOSIS — F1120 Opioid dependence, uncomplicated: Secondary | ICD-10-CM

## 2013-08-22 DIAGNOSIS — O0993 Supervision of high risk pregnancy, unspecified, third trimester: Secondary | ICD-10-CM

## 2013-08-22 DIAGNOSIS — Z331 Pregnant state, incidental: Secondary | ICD-10-CM

## 2013-08-22 DIAGNOSIS — F17200 Nicotine dependence, unspecified, uncomplicated: Secondary | ICD-10-CM

## 2013-08-22 LAB — POCT URINALYSIS DIPSTICK
Glucose, UA: NEGATIVE
Ketones, UA: NEGATIVE
Leukocytes, UA: NEGATIVE
Nitrite, UA: NEGATIVE
RBC UA: NEGATIVE

## 2013-08-22 NOTE — Progress Notes (Signed)
High Risk Pregnancy Diagnosis(es):   Narcotic management on subutex   [id number] Estimated Date of Delivery: 10/05/13  Blood pressure 100/60, weight 120 lb 12.8 oz (54.795 kg).  Urinalysis: urine protein trace, urine glucose negative  HPI: Patient states she is smoking 1 pack per day.  Having lots of pressure.  BP weight and urine results all reviewed and noted. Patient reports good fetal movement, denies any bleeding and no rupture of membranes symptoms or regular contractions.  Fundal Height:  32 cm Fetal Heart rate:  124 Edema:  none  Patient is without complaints. All questions were answered.  Assessment:  3759w5d,   Cervix is closed   Medication(s) Plans:  Continue current medication therapy   Treatment Plan: follow up in 2 weeks   Follow up in 2 weeks for OB appt, PN2

## 2013-08-23 ENCOUNTER — Encounter: Payer: Medicaid Other | Admitting: Obstetrics & Gynecology

## 2013-09-06 ENCOUNTER — Other Ambulatory Visit: Payer: Medicaid Other

## 2013-09-06 ENCOUNTER — Encounter: Payer: Medicaid Other | Admitting: Obstetrics & Gynecology

## 2013-10-04 ENCOUNTER — Ambulatory Visit (INDEPENDENT_AMBULATORY_CARE_PROVIDER_SITE_OTHER): Payer: Medicaid Other | Admitting: Obstetrics and Gynecology

## 2013-10-04 VITALS — BP 106/70 | Wt 120.5 lb

## 2013-10-04 DIAGNOSIS — Z1389 Encounter for screening for other disorder: Secondary | ICD-10-CM

## 2013-10-04 DIAGNOSIS — O368190 Decreased fetal movements, unspecified trimester, not applicable or unspecified: Secondary | ICD-10-CM

## 2013-10-04 DIAGNOSIS — O368131 Decreased fetal movements, third trimester, fetus 1: Secondary | ICD-10-CM

## 2013-10-04 DIAGNOSIS — Z331 Pregnant state, incidental: Secondary | ICD-10-CM | POA: Insufficient documentation

## 2013-10-04 DIAGNOSIS — O0933 Supervision of pregnancy with insufficient antenatal care, third trimester: Secondary | ICD-10-CM

## 2013-10-04 DIAGNOSIS — O09899 Supervision of other high risk pregnancies, unspecified trimester: Secondary | ICD-10-CM

## 2013-10-04 DIAGNOSIS — Z3685 Encounter for antenatal screening for Streptococcus B: Secondary | ICD-10-CM

## 2013-10-04 DIAGNOSIS — O0930 Supervision of pregnancy with insufficient antenatal care, unspecified trimester: Secondary | ICD-10-CM | POA: Insufficient documentation

## 2013-10-04 DIAGNOSIS — Z1159 Encounter for screening for other viral diseases: Secondary | ICD-10-CM

## 2013-10-04 LAB — POCT URINALYSIS DIPSTICK
GLUCOSE UA: NEGATIVE
Ketones, UA: NEGATIVE
Leukocytes, UA: NEGATIVE
Nitrite, UA: NEGATIVE
Protein, UA: NEGATIVE
RBC UA: NEGATIVE

## 2013-10-04 LAB — OB RESULTS CONSOLE GC/CHLAMYDIA
CHLAMYDIA, DNA PROBE: NEGATIVE
Gonorrhea: NEGATIVE

## 2013-10-04 NOTE — Progress Notes (Signed)
High Risk Pregnancy Diagnosis(es):   subutex use, also prior cesarean forTolac.   [id number] [redacted]w[redacted]d Estimated Date of Delivery: 10/05/13  Blood pressure 106/70, weight 120 lb 8 oz (54.658 kg).  Urinalysis: Negative HPI: Pt is complaining of decreased fetal movement.  Her due date is set for tomorrow.   BP weight and urine results all reviewed and noted. Patient reports good fetal movement, denies any bleeding and no rupture of membranes symptoms or regular contractions.  Physical exam: closed cervix.   Fundal Height:  35 Fetal Heart rate:  120 Edema:  negative  Patient is without complaints. All questions were answered.  Assessment:  [redacted]w[redacted]d,   Reactive nST, resolved decreased FM, subutex use, prior cesarean desiring TOLAC, prior successful VBAC P: signed consent for TOLAC      Schedule IOL at 41 wk.        Medication(s) Plans:  No change  Treatment Plan:  Will schedule pt for induction, posted for 7:30 pm on 11 Sept 2015. Tolac signed, and GBS collected, along with GC/Chl  Follow up in 4 days for OB appt, will schedule pt for induction on 9/11, NST reactive criteria.    This chart was scribed by Carl Best, Medical Scribe, for Dr. Christin Bach on 10/04/2013 at 3:37 PM. This chart was reviewed by Dr. Christin Bach for accuracy.

## 2013-10-05 ENCOUNTER — Telehealth (HOSPITAL_COMMUNITY): Payer: Self-pay | Admitting: *Deleted

## 2013-10-05 LAB — GC/CHLAMYDIA PROBE AMP
CT PROBE, AMP APTIMA: NEGATIVE
GC PROBE AMP APTIMA: NEGATIVE

## 2013-10-05 LAB — STREP B DNA PROBE: GBSP: NOT DETECTED

## 2013-10-05 NOTE — Telephone Encounter (Signed)
Preadmission screen  

## 2013-10-07 ENCOUNTER — Encounter (HOSPITAL_COMMUNITY): Payer: Medicaid Other | Admitting: Anesthesiology

## 2013-10-07 ENCOUNTER — Encounter (HOSPITAL_COMMUNITY): Payer: Self-pay

## 2013-10-07 ENCOUNTER — Inpatient Hospital Stay (HOSPITAL_COMMUNITY)
Admission: AD | Admit: 2013-10-07 | Discharge: 2013-10-08 | DRG: 775 | Disposition: A | Discharge status: Home / Self Care | Payer: Medicaid Other | Source: Ambulatory Visit | Attending: Family Medicine | Admitting: Family Medicine

## 2013-10-07 ENCOUNTER — Inpatient Hospital Stay (HOSPITAL_COMMUNITY): Payer: Medicaid Other | Admitting: Anesthesiology

## 2013-10-07 DIAGNOSIS — O99334 Smoking (tobacco) complicating childbirth: Secondary | ICD-10-CM

## 2013-10-07 DIAGNOSIS — Z8249 Family history of ischemic heart disease and other diseases of the circulatory system: Secondary | ICD-10-CM | POA: Diagnosis not present

## 2013-10-07 DIAGNOSIS — Z833 Family history of diabetes mellitus: Secondary | ICD-10-CM | POA: Diagnosis not present

## 2013-10-07 DIAGNOSIS — O34219 Maternal care for unspecified type scar from previous cesarean delivery: Secondary | ICD-10-CM | POA: Diagnosis present

## 2013-10-07 DIAGNOSIS — IMO0001 Reserved for inherently not codable concepts without codable children: Secondary | ICD-10-CM

## 2013-10-07 DIAGNOSIS — O99320 Drug use complicating pregnancy, unspecified trimester: Secondary | ICD-10-CM

## 2013-10-07 DIAGNOSIS — O0993 Supervision of high risk pregnancy, unspecified, third trimester: Secondary | ICD-10-CM

## 2013-10-07 DIAGNOSIS — Z825 Family history of asthma and other chronic lower respiratory diseases: Secondary | ICD-10-CM | POA: Diagnosis not present

## 2013-10-07 DIAGNOSIS — O09212 Supervision of pregnancy with history of pre-term labor, second trimester: Secondary | ICD-10-CM

## 2013-10-07 DIAGNOSIS — Z98891 History of uterine scar from previous surgery: Secondary | ICD-10-CM

## 2013-10-07 DIAGNOSIS — O0933 Supervision of pregnancy with insufficient antenatal care, third trimester: Secondary | ICD-10-CM

## 2013-10-07 DIAGNOSIS — O479 False labor, unspecified: Secondary | ICD-10-CM | POA: Diagnosis present

## 2013-10-07 DIAGNOSIS — A599 Trichomoniasis, unspecified: Secondary | ICD-10-CM

## 2013-10-07 DIAGNOSIS — F1120 Opioid dependence, uncomplicated: Secondary | ICD-10-CM

## 2013-10-07 HISTORY — DX: Other specified health status: Z78.9

## 2013-10-07 LAB — CBC
HEMATOCRIT: 38.5 % (ref 36.0–46.0)
Hemoglobin: 13.6 g/dL (ref 12.0–15.0)
MCH: 33.5 pg (ref 26.0–34.0)
MCHC: 35.3 g/dL (ref 30.0–36.0)
MCV: 94.8 fL (ref 78.0–100.0)
PLATELETS: 164 10*3/uL (ref 150–400)
RBC: 4.06 MIL/uL (ref 3.87–5.11)
RDW: 14.3 % (ref 11.5–15.5)
WBC: 14.8 10*3/uL — ABNORMAL HIGH (ref 4.0–10.5)

## 2013-10-07 LAB — RPR

## 2013-10-07 LAB — TYPE AND SCREEN
ABO/RH(D): O POS
ANTIBODY SCREEN: NEGATIVE

## 2013-10-07 MED ORDER — LANOLIN HYDROUS EX OINT: TOPICAL_OINTMENT | CUTANEOUS | Status: DC | PRN

## 2013-10-07 MED ORDER — DIPHENHYDRAMINE HCL 50 MG/ML IJ SOLN: 12.5000 mg | INTRAMUSCULAR | Status: DC | PRN

## 2013-10-07 MED ORDER — OXYCODONE-ACETAMINOPHEN 5-325 MG PO TABS: 1.0000 | ORAL_TABLET | ORAL | Status: DC | PRN

## 2013-10-07 MED ORDER — DIBUCAINE 1 % RE OINT: 1.0000 "application " | TOPICAL_OINTMENT | RECTAL | Status: DC | PRN

## 2013-10-07 MED ORDER — CITRIC ACID-SODIUM CITRATE 334-500 MG/5ML PO SOLN: 30.0000 mL | ORAL | Status: DC | PRN

## 2013-10-07 MED ORDER — IBUPROFEN 600 MG PO TABS
600.0000 mg | ORAL_TABLET | Freq: Four times a day (QID) | ORAL | Status: DC
  Administered 2013-10-08 (×2): 600 mg via ORAL
  Filled 2013-10-07 (×4): qty 1

## 2013-10-07 MED ORDER — SIMETHICONE 80 MG PO CHEW: 80.0000 mg | CHEWABLE_TABLET | ORAL | Status: DC | PRN

## 2013-10-07 MED ORDER — OXYTOCIN 40 UNITS IN LACTATED RINGERS INFUSION - SIMPLE MED: 62.5000 mL/h | INTRAVENOUS | Status: DC | PRN

## 2013-10-07 MED ORDER — DIPHENHYDRAMINE HCL 25 MG PO CAPS: 25.0000 mg | ORAL_CAPSULE | Freq: Four times a day (QID) | ORAL | Status: DC | PRN

## 2013-10-07 MED ORDER — EPHEDRINE 5 MG/ML INJ
10.0000 mg | INTRAVENOUS | Status: DC | PRN
  Filled 2013-10-07: qty 2

## 2013-10-07 MED ORDER — BISACODYL 10 MG RE SUPP: 10.0000 mg | Freq: Every day | RECTAL | Status: DC | PRN

## 2013-10-07 MED ORDER — SODIUM CHLORIDE 0.9 % IJ SOLN: 3.0000 mL | INTRAMUSCULAR | Status: DC | PRN

## 2013-10-07 MED ORDER — LACTATED RINGERS IV SOLN: 500.0000 mL | Freq: Once | INTRAVENOUS | Status: DC

## 2013-10-07 MED ORDER — MEASLES, MUMPS & RUBELLA VAC ~~LOC~~ INJ: 0.5000 mL | INJECTION | Freq: Once | SUBCUTANEOUS | Status: DC

## 2013-10-07 MED ORDER — ONDANSETRON HCL 4 MG/2ML IJ SOLN: 4.0000 mg | Freq: Four times a day (QID) | INTRAMUSCULAR | Status: DC | PRN

## 2013-10-07 MED ORDER — SENNOSIDES-DOCUSATE SODIUM 8.6-50 MG PO TABS
2.0000 | ORAL_TABLET | ORAL | Status: DC
  Filled 2013-10-07: qty 2

## 2013-10-07 MED ORDER — OXYTOCIN 40 UNITS IN LACTATED RINGERS INFUSION - SIMPLE MED
62.5000 mL/h | INTRAVENOUS | Status: DC
  Filled 2013-10-07: qty 1000

## 2013-10-07 MED ORDER — ZOLPIDEM TARTRATE 5 MG PO TABS
5.0000 mg | ORAL_TABLET | Freq: Every evening | ORAL | Status: DC | PRN
Start: 2013-10-07 — End: 2013-10-08

## 2013-10-07 MED ORDER — OXYCODONE-ACETAMINOPHEN 5-325 MG PO TABS
1.0000 | ORAL_TABLET | ORAL | Status: DC | PRN
  Filled 2013-10-07 (×2): qty 1

## 2013-10-07 MED ORDER — SODIUM CHLORIDE 0.9 % IV SOLN: 250.0000 mL | INTRAVENOUS | Status: DC | PRN

## 2013-10-07 MED ORDER — FLEET ENEMA 7-19 GM/118ML RE ENEM: 1.0000 | ENEMA | RECTAL | Status: DC | PRN

## 2013-10-07 MED ORDER — HYDROXYZINE HCL 50 MG PO TABS
50.0000 mg | ORAL_TABLET | Freq: Four times a day (QID) | ORAL | Status: DC | PRN
  Filled 2013-10-07: qty 1

## 2013-10-07 MED ORDER — OXYCODONE-ACETAMINOPHEN 5-325 MG PO TABS: 2.0000 | ORAL_TABLET | ORAL | Status: DC | PRN

## 2013-10-07 MED ORDER — WITCH HAZEL-GLYCERIN EX PADS: 1.0000 "application " | MEDICATED_PAD | CUTANEOUS | Status: DC | PRN

## 2013-10-07 MED ORDER — OXYTOCIN BOLUS FROM INFUSION
500.0000 mL | INTRAVENOUS | Status: DC
  Administered 2013-10-07: 500 mL via INTRAVENOUS

## 2013-10-07 MED ORDER — PRENATAL MULTIVITAMIN CH
1.0000 | ORAL_TABLET | Freq: Every day | ORAL | Status: DC
  Administered 2013-10-08: 1 via ORAL
  Filled 2013-10-07: qty 1

## 2013-10-07 MED ORDER — IBUPROFEN 600 MG PO TABS
600.0000 mg | ORAL_TABLET | ORAL | Status: DC | PRN
  Administered 2013-10-07 (×2): 600 mg via ORAL
  Filled 2013-10-07 (×2): qty 1

## 2013-10-07 MED ORDER — LIDOCAINE HCL (PF) 1 % IJ SOLN
INTRAMUSCULAR | Status: DC | PRN
Start: 2013-10-07 — End: 2013-10-10
  Administered 2013-10-07: 8 mL

## 2013-10-07 MED ORDER — BUPRENORPHINE HCL 8 MG SL SUBL
8.0000 mg | SUBLINGUAL_TABLET | Freq: Three times a day (TID) | SUBLINGUAL | Status: DC
  Administered 2013-10-07 (×2): 8 mg via SUBLINGUAL
  Filled 2013-10-07 (×2): qty 1

## 2013-10-07 MED ORDER — TETANUS-DIPHTH-ACELL PERTUSSIS 5-2.5-18.5 LF-MCG/0.5 IM SUSP: 0.5000 mL | Freq: Once | INTRAMUSCULAR | Status: DC

## 2013-10-07 MED ORDER — LACTATED RINGERS IV SOLN
500.0000 mL | INTRAVENOUS | Status: DC | PRN
  Administered 2013-10-07 (×2): 1000 mL via INTRAVENOUS

## 2013-10-07 MED ORDER — PHENYLEPHRINE 40 MCG/ML (10ML) SYRINGE FOR IV PUSH (FOR BLOOD PRESSURE SUPPORT)
80.0000 ug | PREFILLED_SYRINGE | INTRAVENOUS | Status: DC | PRN
  Filled 2013-10-07: qty 2
  Filled 2013-10-07: qty 10

## 2013-10-07 MED ORDER — FENTANYL 2.5 MCG/ML BUPIVACAINE 1/10 % EPIDURAL INFUSION (WH - ANES)
14.0000 mL/h | INTRAMUSCULAR | Status: DC | PRN
  Administered 2013-10-07: 14 mL/h via EPIDURAL

## 2013-10-07 MED ORDER — FLEET ENEMA 7-19 GM/118ML RE ENEM: 1.0000 | ENEMA | Freq: Every day | RECTAL | Status: DC | PRN

## 2013-10-07 MED ORDER — ONDANSETRON HCL 4 MG/2ML IJ SOLN: 4.0000 mg | INTRAMUSCULAR | Status: DC | PRN

## 2013-10-07 MED ORDER — ONDANSETRON HCL 4 MG PO TABS
4.0000 mg | ORAL_TABLET | ORAL | Status: DC | PRN
Start: 2013-10-07 — End: 2013-10-08

## 2013-10-07 MED ORDER — ACETAMINOPHEN 325 MG PO TABS
650.0000 mg | ORAL_TABLET | ORAL | Status: DC | PRN
Start: 2013-10-07 — End: 2013-10-07

## 2013-10-07 MED ORDER — PHENYLEPHRINE 40 MCG/ML (10ML) SYRINGE FOR IV PUSH (FOR BLOOD PRESSURE SUPPORT)
80.0000 ug | PREFILLED_SYRINGE | INTRAVENOUS | Status: DC | PRN
  Filled 2013-10-07: qty 2

## 2013-10-07 MED ORDER — BENZOCAINE-MENTHOL 20-0.5 % EX AERO: 1.0000 "application " | INHALATION_SPRAY | CUTANEOUS | Status: DC | PRN

## 2013-10-07 MED ORDER — LIDOCAINE HCL (PF) 1 % IJ SOLN
30.0000 mL | INTRAMUSCULAR | Status: DC | PRN
  Filled 2013-10-07: qty 30

## 2013-10-07 MED ORDER — FENTANYL 2.5 MCG/ML BUPIVACAINE 1/10 % EPIDURAL INFUSION (WH - ANES)
14.0000 mL/h | INTRAMUSCULAR | Status: DC | PRN
  Filled 2013-10-07: qty 125

## 2013-10-07 MED ORDER — SODIUM CHLORIDE 0.9 % IJ SOLN: 3.0000 mL | Freq: Two times a day (BID) | INTRAMUSCULAR | Status: DC

## 2013-10-07 MED ORDER — LACTATED RINGERS IV SOLN: INTRAVENOUS | Status: DC

## 2013-10-07 NOTE — Anesthesia Preprocedure Evaluation (Addendum)
Anesthesia Evaluation  Patient identified by MRN, date of birth, ID band Patient awake    Reviewed: Allergy & Precautions, H&P , Patient's Chart, lab work & pertinent test results  Airway Mallampati: II TM Distance: >3 FB Neck ROM: full    Dental  (+) Teeth Intact   Pulmonary Current Smoker,  breath sounds clear to auscultation        Cardiovascular Rhythm:regular Rate:Normal     Neuro/Psych    GI/Hepatic   Endo/Other    Renal/GU      Musculoskeletal   Abdominal   Peds  Hematology   Anesthesia Other Findings  Takes Subutex daily     Reproductive/Obstetrics (+) Pregnancy                          Anesthesia Physical Anesthesia Plan  ASA: II  Anesthesia Plan: Epidural   Post-op Pain Management:    Induction:   Airway Management Planned:   Additional Equipment:   Intra-op Plan:   Post-operative Plan:   Informed Consent: I have reviewed the patients History and Physical, chart, labs and discussed the procedure including the risks, benefits and alternatives for the proposed anesthesia with the patient or authorized representative who has indicated his/her understanding and acceptance.   Dental Advisory Given  Plan Discussed with:   Anesthesia Plan Comments: (Labs checked- platelets confirmed with RN in room. Fetal heart tracing, per RN, reported to be stable enough for sitting procedure. Discussed epidural, and patient consents to the procedure:  included risk of possible headache,backache, failed block, allergic reaction, and nerve injury. This patient was asked if she had any questions or concerns before the procedure started.)        Anesthesia Quick Evaluation

## 2013-10-07 NOTE — H&P (Signed)
Attestation of Attending Supervision of Advanced Practitioner (PA/CNM/NP): Evaluation and management procedures were performed by the Advanced Practitioner under my supervision and collaboration.  I have reviewed the Advanced Practitioner's note and chart, and I agree with the management and plan.  Reva Bores, MD [facility] Attending 10/07/2013 7:04 AM

## 2013-10-07 NOTE — Progress Notes (Signed)
Pain is much improved after giving 4 pcea

## 2013-10-07 NOTE — Anesthesia Procedure Notes (Addendum)
Epidural Patient location during procedure: OB  Preanesthetic Checklist Completed: patient identified, site marked, surgical consent, pre-op evaluation, timeout performed, IV checked, risks and benefits discussed and monitors and equipment checked  Epidural Patient position: sitting Prep: site prepped and draped and DuraPrep Patient monitoring: continuous pulse ox and blood pressure Approach: midline Location: L4-L5 Injection technique: LOR air  Needle:  Needle type: Tuohy  Needle gauge: 17 G Needle length: 9 cm and 9 Needle insertion depth: 4 cm Catheter type: closed end flexible Catheter size: 19 Gauge Catheter at skin depth: 9 cm Test dose: negative  Assessment Events: blood not aspirated, injection not painful, no injection resistance, negative IV test and no paresthesia  Additional Notes Dosing of Epidural:  1st dose, through catheter ............................................Marland Kitchen Xylocaine 30 mg  2nd dose, through catheter, after waiting 3 minutes.......Marland KitchenXylocaine 50 mg   ( 1% Xylo charted as a single dose in Epic Meds for ease of charting; actual dosing was fractionated as above, for saftey's sake)  As each dose occurred, patient was free of IV sx; and patient exhibited no evidence of SA injection.   Please see RN's note for documentation of vital signs,and FHR which are stable.  Patient reminded not to try to ambulate with numb legs, and that an RN must be present the 1st time she attempts to get up.

## 2013-10-07 NOTE — MAU Note (Signed)
Call to [facility] in [facility] to obtain room for pt. Will call back to MAU.

## 2013-10-07 NOTE — MAU Note (Signed)
Contractions 4 min apart.  Started at 5-6 pm and were at 10 min apart.  Denies bleeding. Denies leaking.  Baby moving well. Pt wants vaginal delivery, has hx of C/S.

## 2013-10-07 NOTE — Anesthesia Postprocedure Evaluation (Signed)
  Anesthesia Post-op Note  Patient: [name] [name]  Procedure(s) Performed: * No procedures listed *  Patient Location: Mother/Baby  Anesthesia Type:Epidural  Level of Consciousness: awake and alert   Airway and Oxygen Therapy: Patient Spontanous Breathing  Post-op Pain: mild  Post-op Assessment: Post-op Vital signs reviewed, Patient's Cardiovascular Status Stable, Respiratory Function Stable, No signs of Nausea or vomiting, Adequate PO intake, Pain level controlled, No headache, No residual numbness and No residual motor weakness  Post-op Vital Signs: Reviewed  Last Vitals:  Filed Vitals:   10/07/13 1415  BP: 119/86  Pulse: 76  Temp: 37 C  Resp: 18    Complications: No apparent anesthesia complications

## 2013-10-07 NOTE — H&P (Signed)
[name] [name] is a 33 y.o. [phone] female at [redacted]w[redacted]d by 8wk u/s, presenting w/ report of uc's that began at 5-6pm and have progressively gotten worse & closer.   Reports active fetal movement, contractions: regular, vaginal bleeding: none, membranes: intact. Initiated prenatal care at [facility] at 9.4 wks.   Pregnancy complicated by limited pnc, never had pn2/gtt. No care from 19 to 27wks, 27-33wks, and 33-39wks.   H/O opioid addiction, on subutex 8 BID. H/O  2 SVDs, then PLTCS for abruption/face presentation, then successful VBAC.   Past Medical History: Past Medical History  Diagnosis Date  . Pregnant 06/04/11    expected delivery date: 10/13/11  . Medical history non-contributory     Past Surgical History: Past Surgical History  Procedure Laterality Date  . Cesarean section      Obstetrical History: OB History   Grav Para Term Preterm Abortions TAB SAB Ect Mult Living   Social History: History   Social History  . Marital Status: Married    Spouse Name: N/A    Number of Children: N/A  . Years of Education: N/A   Social History Main Topics  . Smoking status: Current Every Day Smoker -- 1.00 packs/day    Types: Cigarettes  . Smokeless tobacco: Never Used  . Alcohol Use: No  . Drug Use: No  . Sexual Activity: Yes    Birth Control/ Protection: None   Other Topics Concern  . None   Social History Narrative  . None    Family History: Family History  Problem Relation Age of Onset  . Cancer Other   . Hyperlipidemia Other   . Hypertension Other   . Diabetes Mother   . Hypertension Mother   . Asthma Brother     Allergies: No Known Allergies  Prescriptions prior to admission  Medication Sig Dispense Refill  . buprenorphine (SUBUTEX) 8 MG SUBL Place 8 mg under the tongue 3 (three) times daily.      . Prenatal Vit-Fe Sulfate-FA (PRENATAL VITAMIN PO) Take by mouth daily.      . metroNIDAZOLE (FLAGYL) 500 MG tablet Take 4 tablets (2,000 mg total) by  mouth once.  4 tablet  1     Review of Systems  Pertinent pos/neg as indicated in HPI    Blood pressure 114/69, pulse 86, temperature 97.9 F (36.6 C), temperature source Oral, resp. rate 18, height 5' 2.5" (1.588 m), weight 54.091 kg (119 lb 4 oz), SpO2 96.00%. General appearance: alert, cooperative and no distress Lungs: clear to auscultation bilaterally Heart: regular rate and rhythm Abdomen: gravid, soft, non-tender Extremities: trace edema  Fetal monitoring: FHR: 120 bpm, variability: moderate,  Accelerations: Present,  decelerations:  Present prolonged x 1 Uterine activity: q 3-24mins    SVE: 5/90/-2, vtx by MAU RN  Prenatal labs: ABO, Rh: O/POS/-- (02/03 1057) Antibody: NEG (02/03 1057) Rubella:   RPR: NON REAC (02/03 1057)  HBsAg: NEGATIVE (02/03 1057)  HIV: NON REACTIVE (02/03 1057)  GBS: NOT DETECTED (09/03 1647)   1 hr Glucola: missed appt Genetic screening:  neg Anatomy US: normal  No results found for this or any previous visit (from the past 24 hour(s)).   Assessment:  [redacted]w[redacted]d SIUP  [id number]  Early labor  Cat 2 FHR  GBS NOT DETECTED (09/03 1647)  Plan:  Admit to BS  IV pain meds/epidural prn active labor  Expectant management for now  Anticipate VBAC,  consent under media tab    Marge Duncans CNM, WHNP-BC 10/07/2013, 4:43 AM

## 2013-10-07 NOTE — Progress Notes (Signed)
[name] [name],CNM at bedside; pt given 3rd pca dose, Dr Jean Rosenthal aware

## 2013-10-08 NOTE — Progress Notes (Signed)
Clinical Social Work Department PSYCHOSOCIAL ASSESSMENT - MATERNAL/CHILD 2013/04/22  Patient:  [name] [name], [name] [name]  Account Number:  [id number]  Admit Date:  2013-03-10  Ardine Eng Name:   Mauri Brooklyn   Clinical Social Worker:  Lucita Ferrara, CLINICAL SOCIAL WORKER   Date/Time:  Mar 11, 2013 09:45 AM  Date Referred:  March 26, 2013   Referral source  Central Nursery     Referred reason  [facility]   Other referral source:    I:  FAMILY / [place] Child's legal guardian:  PARENT  Guardian - Name Owings - Age Guardian - Address  [address],  [address]  Uvaldo Bristle  same as above   Other household support members/support persons Name Relationship DOB   SON 32 years old   SON 40 years old   SON 62 years old   SON 60 years old   SON 42 years old   Other support:   MOB stated that the FOB's parents live nearby and offer assistance as needed.    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   Financial resources:  Medicaid If Medicaid - County:  [place] / Grade:  N/A Music therapist / Child Services Coordination / Early Interventions:   N/A  Cultural issues impacting care:   None reported    III  STRENGTHS Strengths  Adequate Resources  Home prepared for Child (including basic supplies)  Supportive family/friends   Strength comment:    IV  RISK FACTORS AND CURRENT PROBLEMS Current Problem:  YES   Risk Factor & Current Problem Patient Issue Family Issue Risk Factor / Current Problem Comment  Substance Abuse Y N MOB has history of Loratab abuse and is currently prescribed Subutex.    V  SOCIAL WORK ASSESSMENT CSW met with MOB in order to complete the assessment. Consult ordered due to limited prenatal care and due to MOB presenting with history of  Lortab abuse and current prescription for Subutex.  FOB was observed to be sleeping in the MOB's bed with the sheet over his head for the entire assessment.  MOB provided consent for assessment to be completed with him being present.  MOB sat on the couch with CSW.  She presented with appropriate mood and range in affect, but she was guarded and presented with minimal interest in processing her thoughts and feelings related to current pregnancy and any potential psychosocial stressors. MOB answered all questions, but answers were short and concise.   MOB discussed that she has all basic items for the baby and that she feels supported by family as she re-adjusts to having another newborn in the home.  CSW attempted to explore stress management and any potential feelings of being overwhelmed due to multiple children in the home, but she only smiled and stated everything was going "fine" at home.  MOB confirmed that she receives Medicaid and [employer], and that she will secure a [facility] appointment once they leave the hospital.   CSW inquired about current Subutex use.  MOB confirmed that Subutex use is being monitored by Dr. Franchot Mimes, she denied any questions or concerns related to Subutex use.  MOB voiced understanding related to hospital drug screen policy.  She stated that she is aware that baby will likely be monitored closely for any potential  withdrawal. MOB stated that it is unlikely that she will be able to stay at the hospital while they monitor her NAS scores since she needs to take care of her other younger children.  CSW validated the tension that she feels as feels torn between her responsibilities as a mother.  MOB responded minimally to validation of feelings, and CSW attempted to continue to offer support as MOB potentially may transition to brief separation from herself and the newborn.    CSW inquired about history of postpartum depression.   MOB denied previous symptoms, but maintained eye contact  with CSW as CSW provided education.  MOB agreeable to contacting medical provider if she experiences symptoms.  CSW discussed risk factors that increase rates of postpartum depression, and MOB verbalized understanding but did not identify any potential factors that may increase her own risk.   No barriers to discharge.  Please re-consult CSW if needs arise.   VI SOCIAL WORK PLAN Social Work Therapist, art  No Further Intervention Required / No Barriers to Discharge   Type of pt/family education:   Postpartum depression and anxiety Hospital drug screen policy   If child protective services report - county:   If child protective services report - date:   Information/referral to community resources comment:   Other social work plan:   CSW to provide ongoing emotional support PRN.

## 2013-10-08 NOTE — Progress Notes (Signed)
Ur chart review completed.  

## 2013-10-08 NOTE — Discharge Summary (Signed)
Obstetric Discharge Summary Reason for Admission: onset of labor Prenatal Procedures: none Intrapartum Procedures: VBAC Postpartum Procedures: none Complications-Operative and Postpartum: none  Delivery Note At 7:22 AM a viable female was delivered via VBAC, Spontaneous (Presentation: ; Occiput Anterior).  APGAR: 9, 9; weight 5 lb 6.1 oz (2440 g).   Placenta status: Intact, Spontaneous.  Cord: 3 vessels with the following complications: None.  Anesthesia: Epidural  Episiotomy: None Lacerations: None Suture Repair: n/a Est. Blood Loss (mL): 100  Mom to postpartum.  Baby to Couplet care / Skin to Skin.  [name] [name] 10/08/2013, 7:43 AM     Hospital Course:  Active Problems:   Active labor at term   Today: No acute events overnight.  Pt denies problems with ambulating, voiding or po intake.  She denies nausea or vomiting.  Pain is well controlled.  She has had flatus. Lochia Minimal.  Plan for birth control is  OP BTL.  Method of Feeding: bottle  [name] [name] is a 33 y.o. [id number] s/p VBAC, #2.   She has postpartum course that was uncomplicated including no problems with ambulating, PO intake, urination, pain, or bleeding. The pt feels ready to go home and  will be discharged with outpatient follow-up.    H/H: Lab Results  Component Value Date/Time   HGB 13.6 10/07/2013  4:55 AM   HCT 38.5 10/07/2013  4:55 AM    Discharge Diagnoses: Term Pregnancy-delivered and VBAC  Discharge Information: Date: 10/08/2013 Activity: pelvic rest Diet: routine  Medications: None Breast feeding:  Yes Condition: stable Instructions: refer to handout Discharge to: home   Discharge Instructions   Call MD for:  persistant nausea and vomiting    Complete by:  As directed      Call MD for:  severe uncontrolled pain    Complete by:  As directed      Call MD for:  temperature >100.4    Complete by:  As directed             Medication List         buprenorphine 8 MG Subl SL  tablet  Commonly known as:  SUBUTEX  Place 8 mg under the tongue 3 (three) times daily.     prenatal multivitamin Tabs tablet  Take 1 tablet by mouth daily at 12 noon.         Perry Mount ,MD OB Fellow 10/08/2013,7:43 AM

## 2013-10-10 ENCOUNTER — Other Ambulatory Visit: Payer: Medicaid Other | Admitting: Obstetrics and Gynecology

## 2013-10-12 ENCOUNTER — Inpatient Hospital Stay (HOSPITAL_COMMUNITY): Admission: RE | Admit: 2013-10-12 | Payer: Medicaid Other | Source: Ambulatory Visit

## 2013-10-13 ENCOUNTER — Ambulatory Visit: Payer: Self-pay

## 2013-10-13 NOTE — Lactation Note (Signed)
This note was copied from the chart of [name] [name]. Lactation Consultation Note  Patient Name: [name] [name] FAOZH'Y Date: 10/13/2013 Reason for consult: Follow-up assessment NICU baby 6 days of life. Mom states that her breasts are filling. Discussed engorgement prevention/treatment. Mom has rented DEBP from hospital. Enc mom to keep pumping and to pump when she visits baby in NICU. Enc mom to call for assistance as needed, aware of OP/BFSG and LC phone line services.  Maternal Data    Feeding Feeding Type: Formula Nipple Type: Slow - flow Length of feed: 20 min  LATCH Score/Interventions                      Lactation Tools Discussed/Used     Consult Status Consult Status: Complete    Myriah Boggus 10/13/2013, 4:20 PM

## 2013-10-15 ENCOUNTER — Encounter: Payer: Self-pay | Admitting: Obstetrics and Gynecology

## 2013-11-07 ENCOUNTER — Encounter: Payer: Self-pay | Admitting: *Deleted

## 2013-11-07 NOTE — Progress Notes (Unsigned)
Patient ID: [name] AcostaRebecca Y [name], female   DOB: 27-Feb-1980, 33 y.o.   MRN: [id number]  I tried to contact pt about scheduling a pp visit but had to lmom.  11/07/2013  AS

## 2013-11-14 ENCOUNTER — Ambulatory Visit (INDEPENDENT_AMBULATORY_CARE_PROVIDER_SITE_OTHER): Payer: Medicaid Other | Admitting: Advanced Practice Midwife

## 2013-11-14 ENCOUNTER — Encounter: Payer: Self-pay | Admitting: Advanced Practice Midwife

## 2013-11-14 NOTE — Progress Notes (Signed)
  [name] AcostaRebecca Y Liendo is a 33 y.o. who presents for a postpartum visit. She is 5 weeks postpartum following a VBAC. I have fully reviewed the prenatal and intrapartum course. The delivery was at 40.2 gestational weeks.  Anesthesia: epidural. Postpartum course has been uneventful. Baby's course has been complicated by 3 week NICU stay for subutex withdrawal. Baby is feeding by bottle. Bleeding: no bleeding. Bowel function is normal. Bladder function is normal. Patient is sexually active. Contraception method is condoms. Postpartum depression screening: negative.    Review of Systems   Constitutional: Negative for fever and chills Eyes: Negative for visual disturbances Respiratory: Negative for shortness of breath, dyspnea Cardiovascular: Negative for chest pain or palpitations  Gastrointestinal: Negative for vomiting, diarrhea and constipation Genitourinary: Negative for dysuria and urgency Musculoskeletal: Negative for back pain, joint pain, myalgias  Neurological: Negative for dizziness and headaches   Objective:     Filed Vitals:   11/14/13 1400  BP: 100/70   General:  alert, cooperative and no distress   Breasts:  negative  Lungs: clear to auscultation bilaterally  Heart:  regular rate and rhythm  Abdomen: Soft, nontender   Vulva:  normal  Vagina: normal vagina  Cervix:  closed  Corpus: Well involuted     Rectal Exam: no hemorrhoids        Assessment:    normal postpartum exam.  Plan:    1. Contraception: IUD 2. Follow up in: next week for Qhcg/IUD. No sex until IUDneeded.

## 2013-11-21 ENCOUNTER — Ambulatory Visit: Payer: Medicaid Other | Admitting: Advanced Practice Midwife

## 2013-11-21 ENCOUNTER — Other Ambulatory Visit: Payer: Medicaid Other

## 2013-11-28 ENCOUNTER — Encounter: Payer: Self-pay | Admitting: Obstetrics & Gynecology

## 2013-11-28 ENCOUNTER — Ambulatory Visit (INDEPENDENT_AMBULATORY_CARE_PROVIDER_SITE_OTHER): Payer: Medicaid Other | Admitting: Advanced Practice Midwife

## 2013-11-28 ENCOUNTER — Ambulatory Visit: Payer: Medicaid Other | Admitting: Advanced Practice Midwife

## 2013-11-28 ENCOUNTER — Encounter: Payer: Self-pay | Admitting: Advanced Practice Midwife

## 2013-11-28 ENCOUNTER — Other Ambulatory Visit: Payer: Medicaid Other

## 2013-11-28 VITALS — BP 122/68 | Ht 62.5 in | Wt 106.5 lb

## 2013-11-28 DIAGNOSIS — Z30430 Encounter for insertion of intrauterine contraceptive device: Secondary | ICD-10-CM

## 2013-11-28 DIAGNOSIS — Z3202 Encounter for pregnancy test, result negative: Secondary | ICD-10-CM

## 2013-11-28 LAB — POCT URINE PREGNANCY: PREG TEST UR: NEGATIVE

## 2013-11-28 NOTE — Progress Notes (Signed)
[name] [name] is a 33 y.o. year old Caucasian female Gravida 5 Para 4105  who presents for placement of a Lyletta IUD. She has not been sexually active in over 2 weeks and her pregnancy test today is negative.    The risks and benefits of the method and placement have been thouroughly reviewed with the patient and all questions were answered.  Specifically the patient is aware of failure rate of 02/998, expulsion of the IUD and of possible perforation.  The patient is aware of irregular bleeding due to the method and understands the incidence of irregular bleeding diminishes with time. She is also aware that as of today, the [name] is approved for 3 years, with the expectation (although not guarantee) that it will receive 5 year, and most likely 7 year, approval).  Time out was performed.  A Graves speculum was placed.  The cervix was prepped using Betadine. The uterus was found to be tilted downward and it sounded to 7 cm.  The cervix was grasped with a tenaculum and the IUD was inserted to 7 cm.  It was pulled back 1 cm and the IUD was disengaged.  The strings were trimmed to 3 cm.  Sonogram was performed and the proper placement of the IUD was verified.  The patient was instructed on signs and symptoms of infection and to check for the strings after each menses or each month.  The patient is to refrain from intercourse for 3 days.  The patient is scheduled for a return appointment after her first menses or 4 weeks.  CRESENZO-DISHMAN,[name] [name] 11/28/2013 2:32 PM

## 2013-12-03 ENCOUNTER — Encounter: Payer: Self-pay | Admitting: Advanced Practice Midwife

## 2013-12-14 IMAGING — CT CT ANGIO CHEST
1 of 6 series · 5 of 36 positions shown · IV contrast (Omnipaque 300)
Comparison: Chest radiograph performed 06/14/2006

CLINICAL DATA: Shortness of breath; dyspnea on exertion.  Cough.
History of smoking.  21 weeks pregnant.

CT ANGIOGRAPHY CHEST
TECHNIQUE: Multidetector CT imaging of the chest using the
standard protocol during bolus administration of intravenous
contrast. Multiplanar reconstructed images including MIPs were
obtained and reviewed to evaluate the vascular anatomy.
The patient talked with Dr. Adarsh regarding the study, its benefits,
risks, and alternatives, and regarding IV contrast and radiation
dosage prior to the study.
Contrast: 100mL OMNIPAQUE IOHEXOL 350 MG/ML SOLN

[Series 5: pe 3.0 b40f · axial · 0.59mm/px · z∈[+708,+876]mm · 5 of 85 slices shown]
[im 15/85  lung]
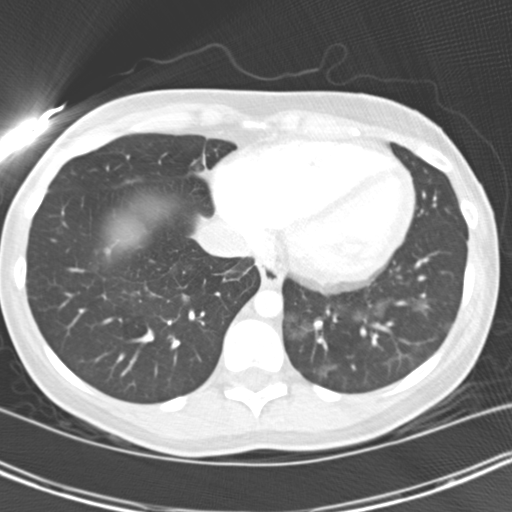
[im 29/85  mediastinal]
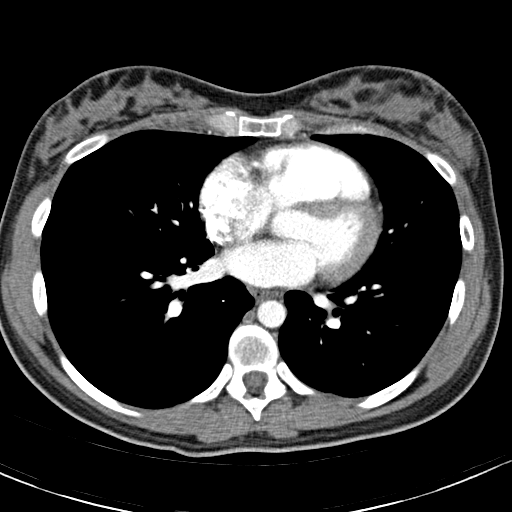
[im 43/85  lung]
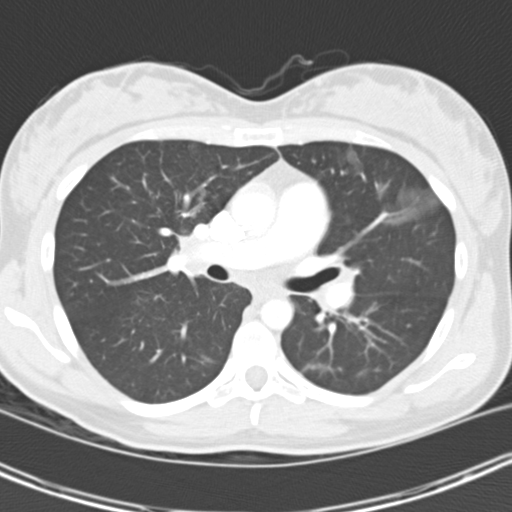
[im 57/85  mediastinal]
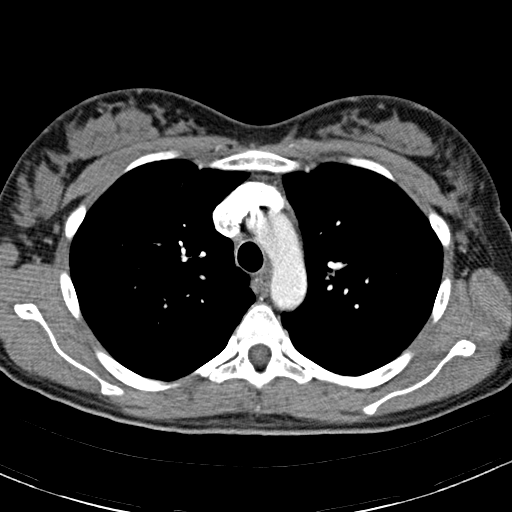
[im 71/85  lung]
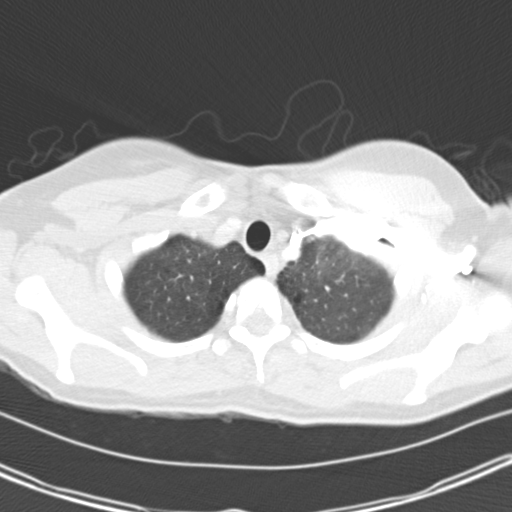

[5 of 36 positions shown; findings below may reference images not displayed]

FINDINGS: There is no evidence of pulmonary embolus.

Scattered ground-glass opacities are noted within both lungs, more
prominent on the left.  This could reflect an unusual infectious
process, mild interstitial edema, or a variety of additional
etiologies.  There is no evidence of pleural effusion or
pneumothorax.  No masses are identified; no abnormal focal contrast
enhancement is seen; the lung bases are incompletely imaged on this
study.

The mediastinum is unremarkable in appearance.  No mediastinal
lymphadenopathy is seen.  No pericardial effusion is identified.
No axillary lymphadenopathy is seen.  The visualized portions of
the thyroid gland are unremarkable in appearance.

The visualized portions of the liver and spleen are unremarkable.

No acute osseous abnormalities are seen.
IMPRESSION: 1.  No evidence of pulmonary embolus.
2.  Scattered ground-glass opacities within both lungs, more
prominent on the left.  This could reflect an unusual infectious
process, mild interstitial edema, or a variety of additional
etiologies.

## 2013-12-26 ENCOUNTER — Encounter: Payer: Self-pay | Admitting: Advanced Practice Midwife

## 2013-12-26 ENCOUNTER — Ambulatory Visit (INDEPENDENT_AMBULATORY_CARE_PROVIDER_SITE_OTHER): Payer: Medicaid Other | Admitting: Advanced Practice Midwife

## 2013-12-26 VITALS — BP 110/60 | Ht 62.0 in | Wt 107.0 lb

## 2013-12-26 DIAGNOSIS — Z30431 Encounter for routine checking of intrauterine contraceptive device: Secondary | ICD-10-CM

## 2013-12-26 MED ORDER — MEGESTROL ACETATE 40 MG PO TABS: ORAL_TABLET | ORAL | Status: DC

## 2013-12-26 MED ORDER — AZITHROMYCIN 250 MG PO TABS: 250.0000 mg | ORAL_TABLET | Freq: Every day | ORAL | Status: DC

## 2013-12-26 NOTE — Progress Notes (Signed)
[facility] Visit  Patient name: Staci AcostaRebecca Y Duet MRN [id number]  Date of birth: [birth year]  CC & HPI:  Staci AcostaRebecca Y Goding is a 33 y.o. Caucasian female presenting today for IUD check.  She had a Lyletta placed a month ago.  She is not having problems except some BTB, only minorly annoying.  Has had URI sx with sinus pressure for > week   Pertinent History Reviewed:  Medical & Surgical Hx:   Past Medical History  Diagnosis Date  . Pregnant 06/04/11    expected delivery date: 10/13/11  . Medical history non-contributory    Past Surgical History  Procedure Laterality Date  . Cesarean section     Medications: Reviewed & Updated - see associated section Social History: Reviewed -  reports that she has been smoking Cigarettes.  She has been smoking about 1.00 pack per day. She has never used smokeless tobacco.  Objective Findings:  Vitals: LMP 11/28/2013  Physical Examination: General appearance - alert, well appearing, and in no distress Mental status - alert, oriented to person, place, and time HEENT:  Tender in frontal and maxillary sinus area.  Runny nose with non productive cought Pelvic - SSE:  Strings visible, scant blood  No results found for this or any previous visit (from the past 24 hour(s)).   Assessment & Plan:  A:   IUD check  URI  P: Megace PRN  Zpack    F/U prn   CRESENZO-DISHMAN,Gabryela Kimbrell CNM 12/26/2013 10:58 AM

## 2014-07-15 ENCOUNTER — Encounter: Payer: Self-pay | Admitting: Emergency Medicine

## 2014-07-15 ENCOUNTER — Emergency Department
Admission: EM | Admit: 2014-07-15 | Discharge: 2014-07-15 | Disposition: A | Discharge status: Home / Self Care | Payer: Medicaid Other | Attending: Emergency Medicine | Admitting: Emergency Medicine

## 2014-07-15 DIAGNOSIS — F1120 Opioid dependence, uncomplicated: Secondary | ICD-10-CM

## 2014-07-15 DIAGNOSIS — F430 Acute stress reaction: Secondary | ICD-10-CM | POA: Diagnosis not present

## 2014-07-15 DIAGNOSIS — F4325 Adjustment disorder with mixed disturbance of emotions and conduct: Secondary | ICD-10-CM | POA: Diagnosis not present

## 2014-07-15 DIAGNOSIS — F329 Major depressive disorder, single episode, unspecified: Secondary | ICD-10-CM | POA: Diagnosis present

## 2014-07-15 DIAGNOSIS — Z720 Tobacco use: Secondary | ICD-10-CM | POA: Insufficient documentation

## 2014-07-15 DIAGNOSIS — Z79899 Other long term (current) drug therapy: Secondary | ICD-10-CM | POA: Diagnosis not present

## 2014-07-15 DIAGNOSIS — F439 Reaction to severe stress, unspecified: Secondary | ICD-10-CM

## 2014-07-15 DIAGNOSIS — F1510 Other stimulant abuse, uncomplicated: Secondary | ICD-10-CM | POA: Insufficient documentation

## 2014-07-15 DIAGNOSIS — Z3202 Encounter for pregnancy test, result negative: Secondary | ICD-10-CM | POA: Diagnosis not present

## 2014-07-15 LAB — URINE DRUG SCREEN, QUALITATIVE (ARMC ONLY)
AMPHETAMINES, UR SCREEN: POSITIVE — AB
BENZODIAZEPINE, UR SCRN: NOT DETECTED
Barbiturates, Ur Screen: NOT DETECTED
COCAINE METABOLITE, UR ~~LOC~~: NOT DETECTED
Cannabinoid 50 Ng, Ur ~~LOC~~: NOT DETECTED
MDMA (Ecstasy)Ur Screen: NOT DETECTED
Methadone Scn, Ur: NOT DETECTED
OPIATE, UR SCREEN: NOT DETECTED
Phencyclidine (PCP) Ur S: NOT DETECTED
Tricyclic, Ur Screen: NOT DETECTED

## 2014-07-15 LAB — COMPREHENSIVE METABOLIC PANEL
ALBUMIN: 4 g/dL (ref 3.5–5.0)
ALT: 15 U/L (ref 14–54)
ANION GAP: 8 (ref 5–15)
AST: 18 U/L (ref 15–41)
Alkaline Phosphatase: 57 U/L (ref 38–126)
BUN: 13 mg/dL (ref 6–20)
CO2: 29 mmol/L (ref 22–32)
Calcium: 8.8 mg/dL — ABNORMAL LOW (ref 8.9–10.3)
Chloride: 102 mmol/L (ref 101–111)
Creatinine, Ser: 0.71 mg/dL (ref 0.44–1.00)
GFR calc Af Amer: >60 mL/min (ref >60)
GFR calc non Af Amer: >60 mL/min (ref >60)
Glucose, Bld: 86 mg/dL (ref 65–99)
Potassium: 3.4 mmol/L — ABNORMAL LOW (ref 3.5–5.1)
SODIUM: 139 mmol/L (ref 135–145)
TOTAL PROTEIN: 6.7 g/dL (ref 6.5–8.1)
Total Bilirubin: 0.2 mg/dL — ABNORMAL LOW (ref 0.3–1.2)

## 2014-07-15 LAB — URINALYSIS COMPLETE WITH MICROSCOPIC (ARMC ONLY)
Bilirubin Urine: NEGATIVE
GLUCOSE, UA: NEGATIVE mg/dL
HGB URINE DIPSTICK: NEGATIVE
Ketones, ur: NEGATIVE mg/dL
Leukocytes, UA: NEGATIVE
NITRITE: NEGATIVE
PH: 6 (ref 5.0–8.0)
Protein, ur: 30 mg/dL — AB
Specific Gravity, Urine: 1.028 (ref 1.005–1.030)

## 2014-07-15 LAB — CBC
HCT: 36.9 % (ref 35.0–47.0)
Hemoglobin: 12.4 g/dL (ref 12.0–16.0)
MCH: 31.3 pg (ref 26.0–34.0)
MCHC: 33.7 g/dL (ref 32.0–36.0)
MCV: 93 fL (ref 80.0–100.0)
PLATELETS: 276 10*3/uL (ref 150–440)
RBC: 3.97 MIL/uL (ref 3.80–5.20)
RDW: 12.6 % (ref 11.5–14.5)
WBC: 8.8 10*3/uL (ref 3.6–11.0)

## 2014-07-15 LAB — SALICYLATE LEVEL

## 2014-07-15 LAB — ETHANOL: Alcohol, Ethyl (B): <5 mg/dL (ref <5)

## 2014-07-15 LAB — POCT PREGNANCY, URINE: Preg Test, Ur: NEGATIVE

## 2014-07-15 LAB — ACETAMINOPHEN LEVEL

## 2014-07-15 NOTE — Discharge Instructions (Signed)
Return to the ER with thoughts of wanting to hurt herself, wanting to hurt others, or for any other concerns.  Stress Stress-related medical problems are becoming increasingly common. The body has a built-in physical response to stressful situations. Faced with pressure, challenge or danger, we need to react quickly. Our bodies release hormones such as cortisol and adrenaline to help do this. These hormones are part of the "fight or flight" response and affect the metabolic rate, heart rate and blood pressure, resulting in a heightened, stressed state that prepares the body for optimum performance in dealing with a stressful situation. It is likely that early man required these mechanisms to stay alive, but usually modern stresses do not call for this, and the same hormones released in today's world can damage health and reduce coping ability. CAUSES  Pressure to perform at work, at school or in sports.  Threats of physical violence.  Money worries.  Arguments.  Family conflicts.  Divorce or separation from significant other.  Bereavement.  New job or unemployment.  Changes in location.  Alcohol or drug abuse. SOMETIMES, THERE IS NO PARTICULAR REASON FOR DEVELOPING STRESS. Almost all people are at risk of being stressed at some time in their lives. It is important to know that some stress is temporary and some is long term.  Temporary stress will go away when a situation is resolved. Most people can cope with short periods of stress, and it can often be relieved by relaxing, taking a walk or getting any type of exercise, chatting through issues with friends, or having a good night's sleep.  Chronic (long-term, continuous) stress is much harder to deal with. It can be psychologically and emotionally damaging. It can be harmful both for an individual and for friends and family. SYMPTOMS Everyone reacts to stress differently. There are some common effects that help [place] recognize it. In  times of extreme stress, people may:  Shake uncontrollably.  Breathe faster and deeper than normal (hyperventilate).  Vomit.  For people with asthma, stress can trigger an attack.  For some people, stress may trigger migraine headaches, ulcers, and body pain. PHYSICAL EFFECTS OF STRESS MAY INCLUDE:  Loss of energy.  Skin problems.  Aches and pains resulting from tense muscles, including neck ache, backache and tension headaches.  Increased pain from arthritis and other conditions.  Irregular heart beat (palpitations).  Periods of irritability or anger.  Apathy or depression.  Anxiety (feeling uptight or worrying).  Unusual behavior.  Loss of appetite.  Comfort eating.  Lack of concentration.  Loss of, or decreased, sex-drive.  Increased smoking, drinking, or recreational drug use.  For women, missed periods.  Ulcers, joint pain, and muscle pain. Post-traumatic stress is the stress caused by any serious accident, strong emotional damage, or extremely difficult or violent experience such as rape or war. Post-traumatic stress victims can experience mixtures of emotions such as fear, shame, depression, guilt or anger. It may include recurrent memories or images that may be haunting. These feelings can last for weeks, months or even years after the traumatic event that triggered them. Specialized treatment, possibly with medicines and psychological therapies, is available. If stress is causing physical symptoms, severe distress or making it difficult for you to function as normal, it is worth seeing your caregiver. It is important to remember that although stress is a usual part of life, extreme or prolonged stress can lead to other illnesses that will need treatment. It is better to visit a doctor sooner rather than  later. Stress has been linked to the development of high blood pressure and heart disease, as well as insomnia and depression. There is no diagnostic test for  stress since everyone reacts to it differently. But a caregiver will be able to spot the physical symptoms, such as:  Headaches.  Shingles.  Ulcers. Emotional distress such as intense worry, low mood or irritability should be detected when the doctor asks pertinent questions to identify any underlying problems that might be the cause. In case there are physical reasons for the symptoms, the doctor may also want to do some tests to exclude certain conditions. If you feel that you are suffering from stress, try to identify the aspects of your life that are causing it. Sometimes you may not be able to change or avoid them, but even a small change can have a positive ripple effect. A simple lifestyle change can make all the difference. STRATEGIES THAT CAN HELP DEAL WITH STRESS:  Delegating or sharing responsibilities.  Avoiding confrontations.  Learning to be more assertive.  Regular exercise.  Avoid using alcohol or street drugs to cope.  Eating a healthy, balanced diet, rich in fruit and vegetables and proteins.  Finding humor or absurdity in stressful situations.  Never taking on more than you know you can handle comfortably.  Organizing your time better to get as much done as possible.  Talking to friends or family and sharing your thoughts and fears.  Listening to music or relaxation tapes.  Relaxation techniques like deep breathing, meditation, and yoga.  Tensing and then relaxing your muscles, starting at the toes and working up to the head and neck. If you think that you would benefit from help, either in identifying the things that are causing your stress or in learning techniques to help you relax, see a caregiver who is capable of helping you with this. Rather than relying on medications, it is usually better to try and identify the things in your life that are causing stress and try to deal with them. There are many techniques of managing stress including counseling,  psychotherapy, aromatherapy, yoga, and exercise. Your caregiver can help you determine what is best for you. Document Released: 04/10/2002 Document Revised: 01/23/2013 Document Reviewed: 03/07/2007 [facility] Patient Information 2015 [place], [place]. This information is not intended to replace advice given to you by your health care provider. Make sure you discuss any questions you have with your health care provider.

## 2014-07-15 NOTE — ED Notes (Signed)
D/c instructions reviewed w/ pt - pt denies any further questions or concerns at present.  Offered patient phone to contact a ride. Patient changing into her clothes.

## 2014-07-15 NOTE — ED Notes (Signed)

## 2014-07-15 NOTE — ED Notes (Signed)
Brought in via [facility] under IVC  depression

## 2014-07-15 NOTE — ED Notes (Signed)
Patient dressed into behavioral scrubs. Shirt, shorts, bra, sandals placed in labeled patient belongings bag. Necklace and 3 rings labeled in a specimen cup and placed in same bag. Patient has a ring to her left thumb and left ring finger that she is unable to remove at this time. Dr Real Cons at bedside evaluating patient.

## 2014-07-15 NOTE — ED Provider Notes (Signed)
[facility] Emergency Department Provider Note  ____________________________________________  Time seen: Approximately 5:30 PM  I have reviewed the triage vital signs and the nursing notes.   HISTORY  Chief Complaint Depression    HPI [name] [name] is a 34 y.o. female who presents under involuntary commitment for reported suicidal ideation after her children were placed in temporary custody by [employer] with her stepmother. [name] denies that she has suicidal thoughts and does not feel that she is at risk for hurting herself. She states she had argued with her sister and had asked her sister not to bring her grandparents over because she was not up to seeing them today and her house was a mess. She got into the shower after getting off the phone and when her sister and grandparents came to the door she did not answer so [name] states that they became concerned and thought she was trying to hurt herself. She is upset about the situation but denies depression or suicidal ideation. She has been healthy recently and denies any illness. She denies any substance abuse.  Past Medical History  Diagnosis Date  . Pregnant 06/04/11    expected delivery date: 10/13/11  . Medical history non-contributory     Patient Active Problem List   Diagnosis Date Noted  . Adjustment disorder with mixed disturbance of emotions and conduct 07/15/2014  . Opiate dependence 07/15/2014  . Encounter for IUD insertion 11/28/2013  . Previous preterm delivery, antepartum 03/06/2013  . Smoker 03/06/2013  . Hx successful VBAC (vaginal birth after cesarean), currently pregnant 03/06/2013    Past Surgical History  Procedure Laterality Date  . Cesarean section      Current Outpatient Rx  Name  Route  Sig  Dispense  Refill  . azithromycin (ZITHROMAX) 250 MG tablet   Oral   Take 1 tablet (250 mg total) by mouth daily. Take 2 today, the 1 a day for 4 days   6 tablet   0   . buprenorphine  (SUBUTEX) 8 MG SUBL   Sublingual   Place 8 mg under the tongue 3 (three) times daily.         . megestrol (MEGACE) 40 MG tablet      Take 3/day (at the same time) for 5 days; 2/day for 5 days, then 1/day PO prn bleeding   60 tablet   3   . Prenatal Vit-Fe Fumarate-FA (PRENATAL MULTIVITAMIN) TABS tablet   Oral   Take 1 tablet by mouth daily at 12 noon.           Allergies Review of patient's allergies indicates no known allergies.  Family History  Problem Relation Age of Onset  . Cancer Other   . Hyperlipidemia Other   . Hypertension Other   . Diabetes Mother   . Hypertension Mother   . Asthma Brother     Social History History  Substance Use Topics  . Smoking status: Current Every Day Smoker -- 1.00 packs/day    Types: Cigarettes  . Smokeless tobacco: Never Used  . Alcohol Use: No    Review of Systems Constitutional: No fever/chills Eyes: No visual changes. ENT: No sore throat. Cardiovascular: Denies chest pain. Respiratory: Denies shortness of breath. Gastrointestinal: No abdominal pain.  No nausea, no vomiting.   Musculoskeletal: Negative for back pain. Skin: Negative for rash. Neurological: Negative for headaches. Psychiatric:No SI/HI Endocrine:No hot/cold intolerance 10-point ROS otherwise negative.  ____________________________________________   PHYSICAL EXAM:  VITAL SIGNS: ED Triage Vitals  Enc Vitals Group     BP 07/15/14 1517 102/72 mmHg     Pulse Rate 07/15/14 1517 84     Resp 07/15/14 1517 20     Temp 07/15/14 1517 97.7 F (36.5 C)     Temp Source 07/15/14 1517 Oral     SpO2 07/15/14 1517 100 %     Weight 07/15/14 1517 108 lb (48.988 kg)     Height 07/15/14 1517  (1.575 m)     Head Cir --      Peak Flow --      Pain Score --      Pain Loc --      Pain Edu? --      Excl. in GC? --     Constitutional: Alert and oriented. Well appearing and in no acute distress. Eyes: Conjunctivae are normal. PERRL. EOMI. Head:  Atraumatic. Nose: No congestion/rhinnorhea. Mouth/Throat: Mucous membranes are moist.  Oropharynx non-erythematous. Neck: No stridor.   Lymphatic: No cervical lymphadenopathy. Cardiovascular: Normal rate, regular rhythm. Grossly normal heart sounds.  Respiratory: Normal respiratory effort.  No retractions. Lungs CTAB. Gastrointestinal: Soft and nontender. No distention. Musculoskeletal: No lower extremity tenderness nor edema.   Neurologic:  Normal speech and language. No gross focal neurologic deficits are appreciated. Speech is normal. Skin:  Skin is warm, dry and intact. No rash noted. Psychiatric: Mood and affect are normal. Speech and behavior are normal.  ____________________________________________   LABS (all labs ordered are listed, but only abnormal results are displayed)  Labs Reviewed  COMPREHENSIVE METABOLIC PANEL - Abnormal; Notable for the following:    Potassium 3.4 (*)    Calcium 8.8 (*)    Total Bilirubin 0.2 (*)    All other components within normal limits  ACETAMINOPHEN LEVEL - Abnormal; Notable for the following:    Acetaminophen (Tylenol), Serum <10 (*)    All other components within normal limits  URINALYSIS COMPLETEWITH MICROSCOPIC ([facility] ONLY) - Abnormal; Notable for the following:    Color, Urine AMBER (*)    APPearance HAZY (*)    Protein, ur 30 (*)    Bacteria, UA RARE (*)    Squamous Epithelial / LPF 6-30 (*)    All other components within normal limits  CBC  ETHANOL  SALICYLATE LEVEL  URINE DRUG SCREEN, QUALITATIVE ([facility] ONLY)  POC URINE PREG, ED  POCT PREGNANCY, URINE   ____________________________________________ ____________________________________________   INITIAL IMPRESSION / ASSESSMENT AND PLAN / ED COURSE  Pertinent labs & imaging results that were available during my care of the patient were reviewed by me and considered in my medical decision making (see chart for details).  Upon seeing and evaluating patient, I do not feel that  she is suicidal or at risk of harming herself. Dr. Toni Amend also saw patient and does not feel she meets admission criteria. She has been seen at [facility] in the past and he feels that she can follow up there voluntarily outpatient. ____________________________________________   FINAL CLINICAL IMPRESSION(S) / ED DIAGNOSES Stress appropriate for situation    Maurilio Lovely, MD 07/15/14 1735

## 2014-07-15 NOTE — Consult Note (Signed)
[place] [facility] Consult   Reason for Consult:  Consult for this 34 year old woman with a history of opiate dependence who was brought in under involuntary commitment papers filed by her family Referring Physician:  mclaurin Patient Identification: [name] [name] MRN:  [id number] Principal Diagnosis: Adjustment disorder with mixed disturbance of emotions and conduct Diagnosis:   Patient Active Problem List   Diagnosis Date Noted  . Adjustment disorder with mixed disturbance of emotions and conduct [F43.25] 07/15/2014  . Opiate dependence [F11.20] 07/15/2014  . Encounter for IUD insertion [Z30.430] 11/28/2013  . Previous preterm delivery, antepartum [O09.219] 03/06/2013  . Smoker [Z72.0] 03/06/2013  . Hx successful VBAC (vaginal birth after cesarean), currently pregnant [O34.21] 03/06/2013    Total Time spent with patient: 1 hour  Subjective:   [name] [name] is a 34 y.o. female patient admitted with "my stepmother just told them she was worried." Patient denies any suicidal ideation.Marland Kitchen  HPI:  Information from the patient and the chart. She was brought in under involuntary commitment paperwork filed by her grandmother stating worries that the patient was suicidal. Patient states that yesterday she was surprised when her 6 children were taken from her and placed in family custody. This happened after her stepmother notified DSS that the patient appeared to be oversedated. Patient absolutely denies that she has relapsed into drug abuse or was doing anything inappropriate. She says that up until yesterday her mood been okay. Had not been having any particular sleep problems. Had not been having any depression. Totally denies any suicidal ideation. After being upset yesterday and today her sister and grandmother said that they were coming to visit her. The patient says that she told him not to calm because she was not taking care of herself in the house was not clean but they came anyway.  She refused to answer the door which apparently was the reason they thought she was trying to kill her self. Patient denies that she had actually said anything about killing herself although she later admits that at one point she may have made a comment that if someone took her children from her that she might as well die. She denies that there was any suicidal intent to any of this. She is not currently getting any mental health treatment for depression or anxiety.  Past psychiatric history: Took an overdose at age 70 but has never tried to kill herself since then. Has not been for outpatient psychiatric treatment since then. DSS had referred her to see [facility] for an evaluation but according to the patient they told her she didn't necessarily need any follow-up. She's not on any psychiatric medicine. Denies any psychiatric hospitalization. She is currently taking Suboxone although it sounds like she is actually in between providers on that medicine.  Substance abuse history: History of dependence on oral pain medicine. She says that she's been on Suboxone for several years now. At baseline she was taking 16 mg a day. She recently has been forced to change providers because of insurance coverage and so she's been taking half doses while she tries to get a new prescriber. Denies that she's been drinking or using any other drugs.  Social history: Patient has 6 children ages 44 and lower. She has a boyfriend/husband who routinely leaves her to run off with other women. That's what's currently happened right now which is why she is dealing with being a single mother. Psych the patient has multiple other family members of her own in the  area. She is working part time just 4 hours a day.  Medical history: No known medical problems except for the substance abuse.  Family history: Denies any family history of mental health or substance abuse problems.  Medication: Suboxone of which she's currently taking 8 mg a  day. Says she's not on anything else. HPI Elements:   Quality:  Depression with potential suicidality. Severity:  Moderate. Timing:  Acute stress since yesterday. Duration:  Getting better but so far just about a day. Context:  Recent DSS intervention which is taken the kids from her custody.  Past Medical History:  Past Medical History  Diagnosis Date  . Pregnant 06/04/11    expected delivery date: 10/13/11  . Medical history non-contributory     Past Surgical History  Procedure Laterality Date  . Cesarean section     Family History:  Family History  Problem Relation Age of Onset  . Cancer Other   . Hyperlipidemia Other   . Hypertension Other   . Diabetes Mother   . Hypertension Mother   . Asthma Brother    Social History:  History  Alcohol Use No     History  Drug Use No    History   Social History  . Marital Status: Married    Spouse Name: N/A  . Number of Children: N/A  . Years of Education: N/A   Social History Main Topics  . Smoking status: Current Every Day Smoker -- 1.00 packs/day    Types: Cigarettes  . Smokeless tobacco: Never Used  . Alcohol Use: No  . Drug Use: No  . Sexual Activity: Yes    Birth Control/ Protection: IUD   Other Topics Concern  . None   Social History Narrative   Additional Social History:                          Allergies:  No Known Allergies  Labs:  Results for orders placed or performed during the hospital encounter of 07/15/14 (from the past 48 hour(s))  CBC     Status: None   Collection Time: 07/15/14  3:25 PM  Result Value Ref Range   WBC 8.8 3.6 - 11.0 K/uL   RBC 3.97 3.80 - 5.20 MIL/uL   Hemoglobin 12.4 12.0 - 16.0 g/dL   HCT 36.9 35.0 - 47.0 %   MCV 93.0 80.0 - 100.0 fL   MCH 31.3 26.0 - 34.0 pg   MCHC 33.7 32.0 - 36.0 g/dL   RDW 12.6 11.5 - 14.5 %   Platelets 276 150 - 440 K/uL  Comprehensive metabolic panel     Status: Abnormal   Collection Time: 07/15/14  3:25 PM  Result Value Ref Range    Sodium 139 135 - 145 mmol/L   Potassium 3.4 (L) 3.5 - 5.1 mmol/L   Chloride 102 101 - 111 mmol/L   CO2 29 22 - 32 mmol/L   Glucose, Bld 86 65 - 99 mg/dL   BUN 13 6 - 20 mg/dL   Creatinine, Ser 0.71 0.44 - 1.00 mg/dL   Calcium 8.8 (L) 8.9 - 10.3 mg/dL   Total Protein 6.7 6.5 - 8.1 g/dL   Albumin 4.0 3.5 - 5.0 g/dL   AST 18 15 - 41 U/L   ALT 15 14 - 54 U/L   Alkaline Phosphatase 57 38 - 126 U/L   Total Bilirubin 0.2 (L) 0.3 - 1.2 mg/dL   GFR calc non Af Amer >60 >60  mL/min   GFR calc Af Amer >60 >60 mL/min    Comment: (NOTE) The eGFR has been calculated using the CKD EPI equation. This calculation has not been validated in all clinical situations. eGFR's persistently <60 mL/min signify possible Chronic Kidney Disease.    Anion gap 8 5 - 15  Ethanol (ETOH)     Status: None   Collection Time: 07/15/14  3:25 PM  Result Value Ref Range   Alcohol, Ethyl (B) <5 <5 mg/dL    Comment:        LOWEST DETECTABLE LIMIT FOR SERUM ALCOHOL IS 5 mg/dL FOR MEDICAL PURPOSES ONLY   Acetaminophen level     Status: Abnormal   Collection Time: 07/15/14  3:25 PM  Result Value Ref Range   Acetaminophen (Tylenol), Serum <10 (L) 10 - 30 ug/mL    Comment:        THERAPEUTIC CONCENTRATIONS VARY SIGNIFICANTLY. A RANGE OF 10-30 ug/mL MAY BE AN EFFECTIVE CONCENTRATION FOR MANY PATIENTS. HOWEVER, SOME ARE BEST TREATED AT CONCENTRATIONS OUTSIDE THIS RANGE. ACETAMINOPHEN CONCENTRATIONS >150 ug/mL AT 4 HOURS AFTER INGESTION AND >50 ug/mL AT 12 HOURS AFTER INGESTION ARE OFTEN ASSOCIATED WITH TOXIC REACTIONS.   Salicylate level     Status: None   Collection Time: 07/15/14  3:25 PM  Result Value Ref Range   Salicylate Lvl <1.1 2.8 - 30.0 mg/dL  Pregnancy, urine POC     Status: None   Collection Time: 07/15/14  4:51 PM  Result Value Ref Range   Preg Test, Ur NEGATIVE NEGATIVE    Comment:        THE SENSITIVITY OF THIS METHODOLOGY IS >24 mIU/mL     Vitals: Blood pressure 102/72, pulse 84,  temperature 97.7 F (36.5 C), temperature source Oral, resp. rate 20, height $RemoveBe'5\' 2"'VDUIpEGPV$  (1.575 m), weight 48.988 kg (108 lb), last menstrual period 07/03/2014, SpO2 100 %, unknown if currently breastfeeding.  Risk to Self: Is patient at risk for suicide?: No Risk to Others:   Prior Inpatient Therapy:   Prior Outpatient Therapy:    No current facility-administered medications for this encounter.   Current Outpatient Prescriptions  Medication Sig Dispense Refill  . azithromycin (ZITHROMAX) 250 MG tablet Take 1 tablet (250 mg total) by mouth daily. Take 2 today, the 1 a day for 4 days 6 tablet 0  . buprenorphine (SUBUTEX) 8 MG SUBL Place 8 mg under the tongue 3 (three) times daily.    . megestrol (MEGACE) 40 MG tablet Take 3/day (at the same time) for 5 days; 2/day for 5 days, then 1/day PO prn bleeding 60 tablet 3  . Prenatal Vit-Fe Fumarate-FA (PRENATAL MULTIVITAMIN) TABS tablet Take 1 tablet by mouth daily at 12 noon.      Musculoskeletal: Strength & Muscle Tone: within normal limits Gait & Station: normal Patient leans: N/A  Psychiatric Specialty Exam: Physical Exam  Constitutional: She appears well-developed and well-nourished.  HENT:  Head: Normocephalic and atraumatic.  Eyes: Conjunctivae are normal. Pupils are equal, round, and reactive to light.  Neck: Normal range of motion.  Cardiovascular: Normal heart sounds.   Respiratory: Effort normal.  GI: Soft.  Musculoskeletal: Normal range of motion.  Neurological: She is alert.  Skin: Skin is warm and dry.  Psychiatric: Her speech is normal and behavior is normal. Judgment and thought content normal. Cognition and memory are normal. She exhibits a depressed mood.  Patient presents as being tearful during interview but it's understandable under the circumstances. Denies suicidal ideation. Appears to be lucid  and appropriate and behavior    Review of Systems  Constitutional: Negative.   HENT: Negative.   Eyes: Negative.    Respiratory: Negative.   Cardiovascular: Negative.   Gastrointestinal: Negative.   Musculoskeletal: Negative.   Skin: Negative.   Neurological: Negative.   Psychiatric/Behavioral: Negative for depression, suicidal ideas, hallucinations, memory loss and substance abuse. The patient is nervous/anxious. The patient does not have insomnia.     Blood pressure 102/72, pulse 84, temperature 97.7 F (36.5 C), temperature source Oral, resp. rate 20, height $RemoveBe'5\' 2"'cMXMaPvTb$  (1.575 m), weight 48.988 kg (108 lb), last menstrual period 07/03/2014, SpO2 100 %, unknown if currently breastfeeding.Body mass index is 19.75 kg/(m^2).  General Appearance: Casual  Eye Contact::  Minimal  Speech:  She is quiet and at times speaks quickly and mumbles but is able to speak clearly when I requested  Volume:  Normal  Mood:  Dysphoric  Affect:  Tearful  Thought Process:  Goal Directed  Orientation:  Full (Time, Place, and Person)  Thought Content:  Negative  Suicidal Thoughts:  No  Homicidal Thoughts:  No  Memory:  Immediate;   Good Recent;   Good Remote;   Good  Judgement:  Fair  Insight:  Fair  Psychomotor Activity:  Normal  Concentration:  Fair  Recall:  [place] of Knowledge:Fair  Language: Good  Akathisia:  No  Handed:  Right  AIMS (if indicated):     Assets:  Communication Skills Desire for Improvement Physical Health Social Support  ADL's:  Intact  Cognition: WNL  Sleep:      Medical Decision Making: Review of Psycho-Social Stressors (1), Review or order clinical lab tests (1), New Problem, with no additional work-up planned (3), Review or order medicine tests (1) and Review of Medication Regimen & Side Effects (2)  Treatment Plan Summary: Plan Patient is currently intermittently tearful but it is congruent with what she is describing in her history. She gives a reliable seeming history of an acute depressed mood since yesterday. She seems to be rational and able to process the situation reasonably  well. Seems to be optimistic about hitting her kids back and taking care of herself. Consistently denies any suicidal ideation at all. She has been seen [facility] once and has the option to go back again. Patient does not meet commitment criteria and does not require hospitalization. Psychoeducation completed. She is urged to go back to [place] if mood continues to be depressed and certainly to notify someone immediately if any suicidal ideation arises. She is going to try to find a new provider for her Suboxone treatment. Case discussed with emergency room physician. IVC discontinued.  Plan:  No evidence of imminent risk to self or others at present.   Patient does not meet criteria for psychiatric inpatient admission. Discussed crisis plan, support from social network, calling 911, coming to the Emergency Department, and calling Suicide Hotline. Disposition: Patient can be released from the emergency room at the discretion of the emergency room physician  Alethia Berthold 07/15/2014 4:56 PM

## 2014-07-15 NOTE — ED Notes (Signed)

## 2014-07-15 NOTE — BH Assessment (Signed)
Assessment Note  [name] [name] is an 34 y.o. female, who presents to the ED via the police for c/o, "my grandmother got upset because, I wouldn't open the door or answer the phone; she called the police and they brought me here; I have six kids from ages 87 y.o to 73 months old; my stepmom took me to [facility] to get me checked for drugs; they checked from my hair follicle; I'm not suicidal; I need to take my son to his baseball game tonight; I need to do that.".   Axis I: Bipolar, mixed Axis II: Deferred Axis III:  Past Medical History  Diagnosis Date  . Pregnant 06/04/11    expected delivery date: 10/13/11  . Medical history non-contributory    Axis IV: other psychosocial or environmental problems, problems related to social environment and problems with primary support group Axis V: 61-70 mild symptoms  Past Medical History:  Past Medical History  Diagnosis Date  . Pregnant 06/04/11    expected delivery date: 10/13/11  . Medical history non-contributory     Past Surgical History  Procedure Laterality Date  . Cesarean section      Family History:  Family History  Problem Relation Age of Onset  . Cancer Other   . Hyperlipidemia Other   . Hypertension Other   . Diabetes Mother   . Hypertension Mother   . Asthma Brother     Social History:  reports that she has been smoking Cigarettes.  She has been smoking about 1.00 pack per day. She has never used smokeless tobacco. She reports that she does not drink alcohol or use illicit drugs.  Additional Social History:     CIWA: CIWA-Ar BP: 102/72 mmHg Pulse Rate: 84 COWS:    Allergies: No Known Allergies  Home Medications:  (Not in a hospital admission)  OB/GYN Status:  Patient's last menstrual period was 07/03/2014 (approximate).  General Assessment Data Location of Assessment: [facility] ED TTS Assessment: In system Is this a Tele or Face-to-Face Assessment?: Face-to-Face Is this an Initial Assessment or a Re-assessment for  this encounter?: Re-Assessment Marital status: Single Maiden name:  (none) Is patient pregnant?: No Pregnancy Status: No Living Arrangements: Spouse/significant other, Children Can pt return to current living arrangement?: Yes Admission Status: Voluntary Is patient capable of signing voluntary admission?: Yes Referral Source: Self/Family/Friend Insurance type: [place] Medicaid  Medical Screening Exam [facility] Walk-in ONLY) Medical Exam completed: Yes  Crisis Care Plan Living Arrangements: Spouse/significant other, Children Name of Psychiatrist: none Name of Therapist: none  Education Status Is patient currently in school?: No Current Grade: n/a Highest grade of school patient has completed: 10th Name of school: n/a Contact person: none  Risk to self with the past 6 months Suicidal Ideation: No Has patient been a risk to self within the past 6 months prior to admission? : No Suicidal Intent: No Has patient had any suicidal intent within the past 6 months prior to admission? : No Is patient at risk for suicide?: No Suicidal Plan?: No Has patient had any suicidal plan within the past 6 months prior to admission? : No Access to Means: No What has been your use of drugs/alcohol within the last 12 months?: denies Previous Attempts/Gestures: No How many times?: 0 Other Self Harm Risks: 0 Triggers for Past Attempts: None known Intentional Self Injurious Behavior: None Family Suicide History: No Recent stressful life event(s): Loss (Comment) (children were taken by DSS) Persecutory voices/beliefs?: No Depression: No Depression Symptoms:  (denies) Substance abuse  history and/or treatment for substance abuse?: Yes Suicide prevention information given to non-admitted patients: Not applicable  Risk to Others within the past 6 months Homicidal Ideation: No Does patient have any lifetime risk of violence toward others beyond the six months prior to admission? : No Thoughts of Harm to  Others: No Current Homicidal Intent: No Current Homicidal Plan: No Access to Homicidal Means: No Identified Victim: none History of harm to others?: No Assessment of Violence: On admission Violent Behavior Description: none Does patient have access to weapons?: No Criminal Charges Pending?: No Does patient have a court date: No Is patient on probation?: No  Psychosis Hallucinations: None noted Delusions: None noted  Mental Status Report Appearance/Hygiene: In scrubs, Unremarkable Eye Contact: Good Motor Activity: Restlessness Speech: Logical/coherent Level of Consciousness: Alert Mood: Anxious Affect: Anxious Anxiety Level: Minimal Thought Processes: Coherent, Circumstantial Judgement: Partial Orientation: Person, Place, Situation Obsessive Compulsive Thoughts/Behaviors: Minimal  Cognitive Functioning Concentration: Good Memory: Recent Intact, Remote Intact IQ: Average Insight: Fair Impulse Control: Fair Appetite: Fair Weight Loss: 0 Weight Gain: 0 Sleep: No Change Total Hours of Sleep: 5 Vegetative Symptoms: None  ADLScreening [facility] Assessment Services) Patient's cognitive ability adequate to safely complete daily activities?: Yes Patient able to express need for assistance with ADLs?: Yes Independently performs ADLs?: Yes (appropriate for developmental age)  Prior Inpatient Therapy Prior Inpatient Therapy: No  Prior Outpatient Therapy Prior Outpatient Therapy: No Does patient have an ACCT team?: No Does patient have Intensive In-House Services?  : No Does patient have [facility] services? : No Does patient have P4CC services?: No  ADL Screening (condition at time of admission) Patient's cognitive ability adequate to safely complete daily activities?: Yes Patient able to express need for assistance with ADLs?: Yes Independently performs ADLs?: Yes (appropriate for developmental age)       Abuse/Neglect Assessment (Assessment to be complete while patient  is alone) Physical Abuse: Denies Verbal Abuse: Denies Sexual Abuse: Denies Exploitation of patient/patient's resources: Denies Self-Neglect: Denies Values / Beliefs Cultural Requests During Hospitalization: None Spiritual Requests During Hospitalization: None Consults Spiritual Care Consult Needed: No Social Work Consult Needed: No      Additional Information 1:1 In Past 12 Months?: No CIRT Risk: No Elopement Risk: No Does patient have medical clearance?: Yes  Child/Adolescent Assessment Running Away Risk: Denies Bed-Wetting: Denies Destruction of Property: Denies Cruelty to Animals: Denies Stealing: Denies Rebellious/Defies Authority: Denies Satanic Involvement: Denies Archivist: Denies Problems at [employer]: Denies Gang Involvement: Denies  Disposition:  Disposition Initial Assessment Completed for this Encounter: Yes Disposition of Patient: Outpatient treatment Type of outpatient treatment: Adult  On Site Evaluation by:   Reviewed with Physician:    Dwan Bolt 07/15/2014 6:56 PM

## 2015-09-10 ENCOUNTER — Ambulatory Visit (INDEPENDENT_AMBULATORY_CARE_PROVIDER_SITE_OTHER): Payer: Medicaid Other | Admitting: Obstetrics & Gynecology

## 2015-09-10 ENCOUNTER — Encounter: Payer: Self-pay | Admitting: Obstetrics & Gynecology

## 2015-09-10 VITALS — BP 90/60 | HR 76 | Ht 62.0 in | Wt 103.0 lb

## 2015-09-10 DIAGNOSIS — Z30431 Encounter for routine checking of intrauterine contraceptive device: Secondary | ICD-10-CM | POA: Diagnosis not present

## 2015-09-10 DIAGNOSIS — T8389XA Other specified complication of genitourinary prosthetic devices, implants and grafts, initial encounter: Secondary | ICD-10-CM

## 2015-09-10 NOTE — Progress Notes (Signed)
      Chief Complaint  Patient presents with  . check iud    think part of iud came out.    Blood pressure 90/60, pulse 76, height 5\' 2"  (1.575 m), weight 103 lb (46.7 kg), unknown if currently breastfeeding.  34 y.o. [id number] No LMP recorded. The current method of family planning is IUD.  Subjective She came in regarding concern about her IUD She had 2 separate episodes of heavy heavy vaginal bleeding with clotting and cramping Each time the bleeding lasted essentially 1 day Since she had had her IUD placed after the birth of her last child in 2015 she's had spotting essentially every time with intercourse but never bleeding like this, she also has discomfort with intercourse She also had concern about possibility of having expelled the IUD She can feel the strings  Objective Sterile speculum exam reveals both strings to be present through the cervical os There is no cervical motion tenderness and the uterus is not tender  I performed an ultrasound in room 8 and the IUD is centrally located in the proper position of the endometrium  Pertinent ROS No burning with urination, frequency or urgency No nausea, vomiting or diarrhea Nor fever chills or other constitutional symptoms    Labs or studies     Impression Diagnoses this Encounter::   ICD-9-CM ICD-10-CM   1. IUD complication, initial encounter 996.76 T83.89XA     Established relevant diagnosis(es):   Plan/Recommendations: No orders of the defined types were placed in this encounter.   Labs or Scans Ordered: No orders of the defined types were placed in this encounter.   Management:: For now we'll leave the IUD in place and we will see Lurena JoinerRebecca back in 3 months for her yearly exam and Pap smear and she'll let me know at that point how she's been doing with her bleeding and the previous 3 months as well as her pain and bleeding with intercourse  Follow up Return in about 3 months (around 12/11/2015) for  yearly.        All questions were answered.

## 2015-12-12 ENCOUNTER — Other Ambulatory Visit: Payer: BLUE CROSS/BLUE SHIELD | Admitting: Obstetrics & Gynecology

## 2020-06-03 ENCOUNTER — Ambulatory Visit: Admit: 2020-06-03 | Discharge status: Absent without leave | Payer: Self-pay

## 2020-06-03 ENCOUNTER — Other Ambulatory Visit: Payer: Self-pay

## 2020-06-03 ENCOUNTER — Ambulatory Visit
Admission: EM | Admit: 2020-06-03 | Discharge: 2020-06-03 | Disposition: A | Discharge status: Home / Self Care | Payer: Medicaid Other | Attending: Family Medicine | Admitting: Family Medicine

## 2020-06-03 ENCOUNTER — Encounter: Payer: Self-pay | Admitting: Emergency Medicine

## 2020-06-03 DIAGNOSIS — B9689 Other specified bacterial agents as the cause of diseases classified elsewhere: Secondary | ICD-10-CM

## 2020-06-03 DIAGNOSIS — N3090 Cystitis, unspecified without hematuria: Secondary | ICD-10-CM | POA: Diagnosis present

## 2020-06-03 LAB — POCT URINALYSIS DIP (MANUAL ENTRY)
Bilirubin, UA: NEGATIVE
Glucose, UA: NEGATIVE mg/dL
Ketones, POC UA: NEGATIVE mg/dL
Leukocytes, UA: NEGATIVE
Nitrite, UA: POSITIVE — AB
Protein Ur, POC: NEGATIVE mg/dL
Spec Grav, UA: 1.025 (ref 1.010–1.025)
Urobilinogen, UA: 0.2 E.U./dL
pH, UA: 7 (ref 5.0–8.0)

## 2020-06-03 MED ORDER — CEPHALEXIN 500 MG PO CAPS: 500.0000 mg | ORAL_CAPSULE | Freq: Two times a day (BID) | ORAL | 0 refills | Status: DC

## 2020-06-03 NOTE — ED Triage Notes (Addendum)
Burning with urination, lower abd pain and fatigue.  Pt has been having symptoms on and off since christmas.

## 2020-06-03 NOTE — ED Provider Notes (Signed)
  [facility]    ASSESSMENT & PLAN:  1. Cystitis    Begin: Meds ordered this encounter  Medications  . cephALEXin (KEFLEX) 500 MG capsule    Sig: Take 1 capsule (500 mg total) by mouth 2 (two) times daily.    Dispense:  10 capsule    Refill:  0   No signs of pyelonephritis. Urine culture sent. Will follow up with her PCP or here if not showing improvement over the next 48 hours, sooner if needed.  Outlined signs and symptoms indicating need for more acute intervention. Patient verbalized understanding. After Visit Summary given.  SUBJECTIVE:  [name] [name] is a 40 y.o. female who complains of urinary frequency, urgency and dysuria for the past few d. Without associated flank pain, fever, chills, vaginal discharge or bleeding. Gross hematuria: not present. No specific aggravating or alleviating factors reported. No LE edema. Normal PO intake without n/v/d. Without specific abdominal pain. Ambulatory without difficulty. OTC treatment: none PTA. H/O UTI: occasional; h/o pyelonephritis.  LMP: No LMP recorded. (Menstrual status: IUD).   OBJECTIVE:  Vitals:   06/03/20 1310  BP: 124/83  Pulse: (!) 102  Resp: 18  Temp: 98.1 F (36.7 C)  TempSrc: Oral  SpO2: 98%   Slight tachycardia noted. General appearance: alert; no distress HENT: oropharynx: moist Lungs: unlabored respirations Back: no CVA tenderness reported Extremities: no edema; symmetrical with no gross deformities Skin: warm and dry Neurologic: normal gait Psychological: alert and cooperative; normal mood and affect  Labs Reviewed  POCT URINALYSIS DIP (MANUAL ENTRY) - Abnormal; Notable for the following components:      Result Value   Clarity, UA cloudy (*)    Blood, UA moderate (*)    Nitrite, UA Positive (*)    All other components within normal limits  URINE CULTURE    No Known Allergies  Past Medical History:  Diagnosis Date  . Medical history non-contributory   . Pregnant 06/04/11    expected delivery date: 10/13/11   Social History   Socioeconomic History  . Marital status: Married    Spouse name: Not on file  . Number of children: Not on file  . Years of education: Not on file  . Highest education level: Not on file  Occupational History  . Not on file  Tobacco Use  . Smoking status: Current Every Day Smoker    Packs/day: 1.00    Types: Cigarettes  . Smokeless tobacco: Never Used  Substance and Sexual Activity  . Alcohol use: No  . Drug use: No  . Sexual activity: Yes    Birth control/protection: I.U.D.  Other Topics Concern  . Not on file  Social History Narrative  . Not on file   Social Determinants of Health   Financial Resource Strain: Not on file  Food Insecurity: Not on file  Transportation Needs: Not on file  Physical Activity: Not on file  Stress: Not on file  Social Connections: Not on file  Intimate Partner Violence: Not on file   Family History  Problem Relation Age of Onset  . Cancer Other   . Hyperlipidemia Other   . Hypertension Other   . Diabetes Mother   . Hypertension Mother   . Asthma Brother        Mardella Layman, MD 06/03/20 1324

## 2020-06-05 LAB — URINE CULTURE

## 2020-06-06 LAB — URINE CULTURE: Culture: >=100000 — AB

## 2020-11-25 ENCOUNTER — Ambulatory Visit
Admission: EM | Admit: 2020-11-25 | Discharge: 2020-11-25 | Disposition: A | Discharge status: Home / Self Care | Payer: Medicaid Other | Attending: Emergency Medicine | Admitting: Emergency Medicine

## 2020-11-25 ENCOUNTER — Encounter: Payer: Self-pay | Admitting: Emergency Medicine

## 2020-11-25 ENCOUNTER — Other Ambulatory Visit: Payer: Self-pay

## 2020-11-25 DIAGNOSIS — R319 Hematuria, unspecified: Secondary | ICD-10-CM | POA: Insufficient documentation

## 2020-11-25 DIAGNOSIS — N12 Tubulo-interstitial nephritis, not specified as acute or chronic: Secondary | ICD-10-CM | POA: Diagnosis not present

## 2020-11-25 DIAGNOSIS — N390 Urinary tract infection, site not specified: Secondary | ICD-10-CM | POA: Diagnosis not present

## 2020-11-25 LAB — POCT URINALYSIS DIP (MANUAL ENTRY)
Bilirubin, UA: NEGATIVE
Glucose, UA: NEGATIVE mg/dL
Ketones, POC UA: NEGATIVE mg/dL
Nitrite, UA: POSITIVE — AB
Protein Ur, POC: 100 mg/dL — AB
Spec Grav, UA: 1.025 (ref 1.010–1.025)
Urobilinogen, UA: 0.2 E.U./dL
pH, UA: 5.5 (ref 5.0–8.0)

## 2020-11-25 MED ORDER — IBUPROFEN 600 MG PO TABS: 600.0000 mg | ORAL_TABLET | Freq: Four times a day (QID) | ORAL | 0 refills | Status: AC | PRN

## 2020-11-25 MED ORDER — CEPHALEXIN 500 MG PO CAPS: 500.0000 mg | ORAL_CAPSULE | Freq: Four times a day (QID) | ORAL | 0 refills | Status: AC

## 2020-11-25 MED ORDER — CEFTRIAXONE SODIUM 1 G IJ SOLR
1.0000 g | Freq: Once | INTRAMUSCULAR | Status: AC
  Administered 2020-11-25: 1 g via INTRAMUSCULAR

## 2020-11-25 MED ORDER — PHENAZOPYRIDINE HCL 200 MG PO TABS: 200.0000 mg | ORAL_TABLET | Freq: Three times a day (TID) | ORAL | 0 refills | Status: AC | PRN

## 2020-11-25 NOTE — ED Triage Notes (Signed)
Fever since yesterday.  Chills and lower back pain and lower abd pain.  Hurts to urinate x the past couple of days.

## 2020-11-25 NOTE — ED Provider Notes (Signed)
HPI  SUBJECTIVE:  [name] [name] is a 40 y.o. female who presents with 2 days of dysuria, odorous urine, constant suprapubic, flank, back pain, fevers T-max 103.4, nausea.  No urinary urgency, frequency, cloudy urine, hematuria.  No vomiting, vaginal complaints.  She is in a long-term monogamous relationship with a female who is asymptomatic.  STDs are not a concern today.  No antibiotics in the past month.  She took ibuprofen within 6 hours of evaluation.  She has been taking ibuprofen 600 mg every 6 hours with temporary improvement in fever and pain.  Symptoms are worse with movement.  She has a past medical history of UTI, pyelonephritis and vaginal yeast infections.  No history of nephrolithiasis, STDs, BV, PID, diabetes.  LMP: Has an IUD.  Denies the possibility being pregnant.  PMD: None.  Past Medical History:  Diagnosis Date   Medical history non-contributory    Pregnant 06/04/11   expected delivery date: 10/13/11    Past Surgical History:  Procedure Laterality Date   CESAREAN SECTION      Family History  Problem Relation Age of Onset   Cancer Other    Hyperlipidemia Other    Hypertension Other    Diabetes Mother    Hypertension Mother    Asthma Brother     Social History   Tobacco Use   Smoking status: Every Day    Packs/day: 1.00    Types: Cigarettes   Smokeless tobacco: Never  Substance Use Topics   Alcohol use: No   Drug use: No    No current facility-administered medications for this encounter.  Current Outpatient Medications:    buprenorphine (SUBUTEX) 8 MG SUBL, Place 8 mg under the tongue 3 (three) times daily., Disp: , Rfl:    cephALEXin (KEFLEX) 500 MG capsule, Take 1 capsule (500 mg total) by mouth 2 (two) times daily., Disp: 10 capsule, Rfl: 0  No Known Allergies   ROS  As noted in HPI.   Physical Exam  BP 124/85 (BP Location: Right Arm)   Pulse 99   Temp 99 F (37.2 C) (Oral)   Resp 16   SpO2 98%   Constitutional: Well developed, well  nourished, no acute distress.  Appears ill. Eyes:  EOMI, conjunctiva normal bilaterally HENT: Normocephalic, atraumatic,mucus membranes moist Respiratory: Normal inspiratory effort Cardiovascular: Normal rate GI: nondistended.  No suprapubic tenderness.  Positive bilateral flank tenderness. Back: Left CVA tenderness skin: No rash, skin intact Musculoskeletal: no deformities Neurologic: Alert & oriented x 3, no focal neuro deficits Psychiatric: Speech and behavior appropriate   ED Course   Medications - No data to display  Orders Placed This Encounter  Procedures   Urine Culture    Standing Status:   Standing    Number of Occurrences:   1    Order Specific Question:   Indication    Answer:   Dysuria   POCT urinalysis dipstick    Standing Status:   Standing    Number of Occurrences:   1    Results for orders placed or performed during the hospital encounter of 11/25/20 (from the past 24 hour(s))  POCT urinalysis dipstick     Status: Abnormal   Collection Time: 11/25/20  7:50 PM  Result Value Ref Range   Color, UA straw (A) yellow   Clarity, UA clear clear   Glucose, UA negative negative mg/dL   Bilirubin, UA negative negative   Ketones, POC UA negative negative mg/dL   Spec Grav, UA 6.761 9.509 -  1.025   Blood, UA moderate (A) negative   pH, UA 5.5 5.0 - 8.0   Protein Ur, POC =100 (A) negative mg/dL   Urobilinogen, UA 0.2 0.2 or 1.0 E.U./dL   Nitrite, UA Positive (A) Negative   Leukocytes, UA Small (1+) (A) Negative   No results found.  ED Clinical Impression  1. Urinary tract infection with hematuria, site unspecified      ED Assessment/Plan  Previous labs reviewed.  Last urine culture 06/2020 grew E. coli UTI that was pansensitive.  Presentation concerning for pyelonephritis.  She has never had a kidney stone, but in the differential is infected nephrolithiasis.  Discussed with patient that we could get a KUB to look for nephrolithiasis, however it would not  pick up all kidney stones.  She declined a KUB here today and has opted for medical treatment for pyelonephritis.  Giving 1 g of Rocephin, having her push extra fluids, sent home with Keflex, Pyridium, Tylenol/ibuprofen.  She declined a prescription of Zofran.  Urine culture sent.  If she is not getting any better in 24 hours, she will go to the ER.  If she gets worse in the interim, she will go to the ED.  Follow-up with [facility].  Will place order for assistance in finding a PMD.  Discussed labs, MDM, treatment plan, and plan for follow-up with patient. Discussed sn/sx that should prompt return to the ED. patient agrees with plan.   No orders of the defined types were placed in this encounter.     *This clinic note was created using Dragon dictation software. Therefore, there may be occasional mistakes despite careful proofreading.  ?    Domenick Gong, MD 11/26/20 0700

## 2020-11-25 NOTE — Discharge Instructions (Signed)
Push plenty of extra fluids.  600 mg of ibuprofen combined with 1000 mg of Tylenol together 3-4 times a day as needed for fevers, body aches.  Finish the Keflex, even if you feel better.  The Pyridium will help with the urinary symptoms.

## 2020-11-28 LAB — URINE CULTURE: Culture: >=100000 — AB

## 2021-03-01 ENCOUNTER — Ambulatory Visit
Admission: EM | Admit: 2021-03-01 | Discharge: 2021-03-01 | Disposition: A | Discharge status: Home / Self Care | Payer: Medicaid Other | Attending: Student | Admitting: Student

## 2021-03-01 ENCOUNTER — Other Ambulatory Visit: Payer: Self-pay

## 2021-03-01 ENCOUNTER — Encounter: Payer: Self-pay | Admitting: *Deleted

## 2021-03-01 DIAGNOSIS — Z0189 Encounter for other specified special examinations: Secondary | ICD-10-CM | POA: Diagnosis present

## 2021-03-01 DIAGNOSIS — Z975 Presence of (intrauterine) contraceptive device: Secondary | ICD-10-CM | POA: Diagnosis present

## 2021-03-01 DIAGNOSIS — N12 Tubulo-interstitial nephritis, not specified as acute or chronic: Secondary | ICD-10-CM | POA: Diagnosis present

## 2021-03-01 DIAGNOSIS — R300 Dysuria: Secondary | ICD-10-CM | POA: Diagnosis not present

## 2021-03-01 LAB — POCT URINALYSIS DIP (MANUAL ENTRY)
Bilirubin, UA: NEGATIVE
Glucose, UA: NEGATIVE mg/dL
Ketones, POC UA: NEGATIVE mg/dL
Leukocytes, UA: NEGATIVE
Nitrite, UA: NEGATIVE
Protein Ur, POC: NEGATIVE mg/dL
Spec Grav, UA: >=1.03 — AB (ref 1.010–1.025)
Urobilinogen, UA: 0.2 E.U./dL
pH, UA: 6 (ref 5.0–8.0)

## 2021-03-01 LAB — POCT URINE PREGNANCY: Preg Test, Ur: NEGATIVE

## 2021-03-01 MED ORDER — NITROFURANTOIN MONOHYD MACRO 100 MG PO CAPS: 100.0000 mg | ORAL_CAPSULE | Freq: Two times a day (BID) | ORAL | 0 refills | Status: AC

## 2021-03-01 MED ORDER — CEFTRIAXONE SODIUM 500 MG IJ SOLR
500.0000 mg | Freq: Once | INTRAMUSCULAR | Status: AC
  Administered 2021-03-01: 500 mg via INTRAMUSCULAR

## 2021-03-01 NOTE — Discharge Instructions (Addendum)
-  Macrobid twice daily x5 days -Drink plenty of water -Seek additional immediate medical attention if symptoms get worse - abdominal pain, back pain, fevers, etc.  -Establish care with new PCP. They can hopefully sen you up with a urologist - a specialist that can help

## 2021-03-01 NOTE — ED Triage Notes (Signed)
C/O dysuria, polyuria, and urinary urgency onset approx 2-3 months ago, with general malaise over past 1.5 wks. States has been having sxs intermittently over past yr, states feels abx don't work well for her. No known fevers.

## 2021-03-01 NOTE — ED Provider Notes (Addendum)
[facility]    CSN: [id number] Arrival date & time: 03/01/21  0946      History   Chief Complaint Chief Complaint  Patient presents with   Dysuria    HPI [name] [name] is a 41 y.o. female presenting with urinary symptoms for about 12 months.  IUD contraception.  Describes 3 months of hematuria, dysuria, polyuria, urinary urgency, with new onset of general malaise for about 1.5 weeks.  Unable to describe why she is feeling particularly bad today, other than she is tired.  Has been to urgent care multiple times about this issue, states that antibiotics do not work.  Today states that she has been having bilateral flank and upper quadrant pain for the last 10 days, worse over the left side.  Describes the pain as constant and sharp, without colicky or crampy nature.  Denies vaginal symptoms or STI risk.  IUD contraception, she is not currently on her period.  She does not have a primary care provider, and has not seen a specialist for this despite 12 months of symptoms  HPI  Past Medical History:  Diagnosis Date   Pregnant 06/04/2011   expected delivery date: 10/13/11    Patient Active Problem List   Diagnosis Date Noted   Adjustment disorder with mixed disturbance of emotions and conduct 07/15/2014   Opiate dependence ([place]) 07/15/2014   Encounter for IUD insertion 11/28/2013   Previous preterm delivery, antepartum 03/06/2013   Smoker 03/06/2013   Hx successful VBAC (vaginal birth after cesarean), currently pregnant 03/06/2013    Past Surgical History:  Procedure Laterality Date   CESAREAN SECTION      OB History     Gravida  5   Para  5   Term  4   Preterm  1   AB      Living  6      SAB      IAB      Ectopic      Multiple  1   Live Births  6            Home Medications    Prior to Admission medications   Medication Sig Start Date End Date Taking? Authorizing Provider  Buprenorphine HCl-Naloxone HCl (SUBOXONE SL) Place under the  tongue.   Yes [provider]  nitrofurantoin, macrocrystal-monohydrate, (MACROBID) 100 MG capsule Take 1 capsule (100 mg total) by mouth 2 (two) times daily. 03/01/21  Yes Hazel Sams, PA-C  buprenorphine (SUBUTEX) 8 MG SUBL Place 8 mg under the tongue 3 (three) times daily.    [provider]  ibuprofen (ADVIL) 600 MG tablet Take 1 tablet (600 mg total) by mouth every 6 (six) hours as needed. 11/25/20   Melynda Ripple, MD  phenazopyridine (PYRIDIUM) 200 MG tablet Take 1 tablet (200 mg total) by mouth 3 (three) times daily as needed for pain. 11/25/20   Melynda Ripple, MD    Family History Family History  Problem Relation Age of Onset   Diabetes Mother    Hypertension Mother    Asthma Brother    Cancer Other    Hyperlipidemia Other    Hypertension Other     Social History Social History   Tobacco Use   Smoking status: Every Day    Packs/day: 1.00    Types: Cigarettes   Smokeless tobacco: Never  Vaping Use   Vaping Use: Never used  Substance Use Topics   Alcohol use: No   Drug use: Not Currently  Allergies   Patient has no known allergies.   Review of Systems Review of Systems  Constitutional:  Negative for appetite change, chills, diaphoresis and fever.  Respiratory:  Negative for shortness of breath.   Cardiovascular:  Negative for chest pain.  Gastrointestinal:  Positive for abdominal pain. Negative for blood in stool, constipation, diarrhea, nausea and vomiting.  Genitourinary:  Positive for dysuria, flank pain, frequency and hematuria. Negative for decreased urine volume, difficulty urinating, genital sores and urgency.  Musculoskeletal:  Negative for back pain.  Neurological:  Negative for dizziness, weakness and light-headedness.  All other systems reviewed and are negative.   Physical Exam Triage Vital Signs ED Triage Vitals  Enc Vitals Group     BP 03/01/21 1110 107/77     Pulse Rate 03/01/21 1110 75     Resp 03/01/21 1110  16     Temp 03/01/21 1110 98.1 F (36.7 C)     Temp Source 03/01/21 1110 Oral     SpO2 03/01/21 1110 97 %     Weight --      Height --      Head Circumference --      Peak Flow --      Pain Score 03/01/21 1111 1     Pain Loc --      Pain Edu? --      Excl. in [place]? --    No data found.  Updated Vital Signs BP 107/77    Pulse 75    Temp 98.1 F (36.7 C) (Oral)    Resp 16    SpO2 97%    Breastfeeding No   Visual Acuity Right Eye Distance:   Left Eye Distance:   Bilateral Distance:    Right Eye Near:   Left Eye Near:    Bilateral Near:     Physical Exam Vitals reviewed.  Constitutional:      General: She is not in acute distress.    Appearance: Normal appearance. She is not ill-appearing.     Comments: Thin female appears older than states age   HENT:     Head: Normocephalic and atraumatic.     Mouth/Throat:     Mouth: Mucous membranes are moist.     Comments: Moist mucous membranes Eyes:     Extraocular Movements: Extraocular movements intact.     Pupils: Pupils are equal, round, and reactive to light.  Cardiovascular:     Rate and Rhythm: Normal rate and regular rhythm.     Heart sounds: Normal heart sounds.  Pulmonary:     Effort: Pulmonary effort is normal.     Breath sounds: Normal breath sounds. No wheezing, rhonchi or rales.  Abdominal:     General: Bowel sounds are normal. There is no distension.     Palpations: Abdomen is soft. There is no mass.     Tenderness: There is abdominal tenderness in the right upper quadrant and left upper quadrant. There is right CVA tenderness and left CVA tenderness. There is no guarding or rebound. Negative signs include Murphy's sign, Rovsing's sign and McBurney's sign.     Comments: There is bilateral right and left upper quadrant tenderness to palpation, without guarding, mass, rebound.  Also with bilateral flank pain.  Comfortable throughout exam.  Skin:    General: Skin is warm.     Capillary Refill: Capillary refill  takes less than 2 seconds.     Comments: Good skin turgor  Neurological:     General: No focal deficit  present.     Mental Status: She is alert and oriented to person, place, and time.  Psychiatric:        Mood and Affect: Mood normal.        Behavior: Behavior normal.     UC Treatments / Results  Labs (all labs ordered are listed, but only abnormal results are displayed) Labs Reviewed  POCT URINALYSIS DIP (MANUAL ENTRY) - Abnormal; Notable for the following components:      Result Value   Spec Grav, UA >=1.030 (*)    Blood, UA moderate (*)    All other components within normal limits  URINE CULTURE  COMPREHENSIVE METABOLIC PANEL  POCT URINE PREGNANCY    EKG   Radiology No results found.  Procedures Procedures (including critical care time)  Medications Ordered in UC Medications  cefTRIAXone (ROCEPHIN) injection 500 mg (has no administration in time range)    Initial Impression / Assessment and Plan / UC Course  I have reviewed the triage vital signs and the nursing notes.  Pertinent labs & imaging results that were available during my care of the patient were reviewed by me and considered in my medical decision making (see chart for details).     This patient is a very pleasant 41 y.o. year old female presenting with urinary symptoms x12 months. Afebrile, nontachy. Bilateral CVAT today L>R, raising concern for pyelo.  We have seen her multiple times for this issue in the last 12 months, she has not been seen by a primary care provider or specialist for this.  She has not had routine medical care in years.  UA with moderate blood, negative nitrite, negative leuk.  Culture sent.  Blood was present on UA from 3 months ago and from 9 months ago. U-preg negative.  IUD contraception.  Ddx is cystitis vs pyelonephritis vs nephrolithiasis vs OAB vs other.   She was treated with Keflex the last 2 visits, with improvement but then recurrence of symptoms. We will manage  with Macrobid and IM Rocephin today.  As she has not had a CMP in years, we will check this today as well.  PCP referral placed. Given intermittent symptoms for 12 months, strongly recommended she follow-up with a urologist at this point.  Given Medicaid insurance, she will need this referral from her primary care provider.  ED return precautions discussed. Patient verbalizes understanding and agreement.   Coding Level 4 for acute illness with systemic symptoms, and prescription drug management   Final Clinical Impressions(s) / UC Diagnoses   Final diagnoses:  Dysuria  IUD contraception  Pyelonephritis  Routine lab draw     Discharge Instructions      -Macrobid twice daily x5 days -Drink plenty of water -Seek additional immediate medical attention if symptoms get worse - abdominal pain, back pain, fevers, etc.  -Establish care with new PCP. They can hopefully sen you up with a urologist - a specialist that can help     ED Prescriptions     Medication Sig Dispense Auth. Provider   nitrofurantoin, macrocrystal-monohydrate, (MACROBID) 100 MG capsule Take 1 capsule (100 mg total) by mouth 2 (two) times daily. 10 capsule Hazel Sams, PA-C      PDMP not reviewed this encounter.   Hazel Sams, PA-C 03/01/21 1216    Hazel Sams, PA-C 03/01/21 1758

## 2021-03-02 LAB — COMPREHENSIVE METABOLIC PANEL
ALT: 15 IU/L (ref 0–32)
AST: 21 IU/L (ref 0–40)
Albumin/Globulin Ratio: 2 (ref 1.2–2.2)
Albumin: 4.8 g/dL (ref 3.8–4.8)
Alkaline Phosphatase: 105 IU/L (ref 44–121)
BUN/Creatinine Ratio: 11 (ref 9–23)
BUN: 9 mg/dL (ref 6–20)
Bilirubin Total: <0.2 mg/dL (ref 0.0–1.2)
CO2: 25 mmol/L (ref 20–29)
Calcium: 9.5 mg/dL (ref 8.7–10.2)
Chloride: 100 mmol/L (ref 96–106)
Creatinine, Ser: 0.84 mg/dL (ref 0.57–1.00)
Globulin, Total: 2.4 g/dL (ref 1.5–4.5)
Glucose: 76 mg/dL (ref 70–99)
Potassium: 3.8 mmol/L (ref 3.5–5.2)
Sodium: 139 mmol/L (ref 134–144)
Total Protein: 7.2 g/dL (ref 6.0–8.5)
eGFR: 91 mL/min/{1.73_m2} (ref >59)

## 2021-03-02 LAB — URINE CULTURE

## 2021-05-17 ENCOUNTER — Emergency Department (HOSPITAL_COMMUNITY)
Admission: EM | Admit: 2021-05-17 | Discharge: 2021-05-17 | Disposition: A | Discharge status: Home / Self Care | Payer: Medicaid Other | Attending: Emergency Medicine | Admitting: Emergency Medicine

## 2021-05-17 ENCOUNTER — Encounter (HOSPITAL_COMMUNITY): Payer: Self-pay

## 2021-05-17 ENCOUNTER — Other Ambulatory Visit: Payer: Self-pay

## 2021-05-17 ENCOUNTER — Emergency Department (HOSPITAL_COMMUNITY): Payer: Medicaid Other

## 2021-05-17 DIAGNOSIS — K0889 Other specified disorders of teeth and supporting structures: Secondary | ICD-10-CM

## 2021-05-17 DIAGNOSIS — N3001 Acute cystitis with hematuria: Secondary | ICD-10-CM

## 2021-05-17 DIAGNOSIS — R109 Unspecified abdominal pain: Secondary | ICD-10-CM | POA: Diagnosis present

## 2021-05-17 DIAGNOSIS — Z202 Contact with and (suspected) exposure to infections with a predominantly sexual mode of transmission: Secondary | ICD-10-CM | POA: Diagnosis not present

## 2021-05-17 DIAGNOSIS — K5900 Constipation, unspecified: Secondary | ICD-10-CM

## 2021-05-17 LAB — CBC WITH DIFFERENTIAL/PLATELET
Abs Immature Granulocytes: 0.05 10*3/uL (ref 0.00–0.07)
Basophils Absolute: 0.1 10*3/uL (ref 0.0–0.1)
Basophils Relative: 0 %
Eosinophils Absolute: 0.1 10*3/uL (ref 0.0–0.5)
Eosinophils Relative: 0 %
HCT: 45.3 % (ref 36.0–46.0)
Hemoglobin: 15.2 g/dL — ABNORMAL HIGH (ref 12.0–15.0)
Immature Granulocytes: 0 %
Lymphocytes Relative: 7 %
Lymphs Abs: 1 10*3/uL (ref 0.7–4.0)
MCH: 31.1 pg (ref 26.0–34.0)
MCHC: 33.6 g/dL (ref 30.0–36.0)
MCV: 92.6 fL (ref 80.0–100.0)
Monocytes Absolute: 0.6 10*3/uL (ref 0.1–1.0)
Monocytes Relative: 5 %
Neutro Abs: 11.8 10*3/uL — ABNORMAL HIGH (ref 1.7–7.7)
Neutrophils Relative %: 88 %
Platelets: 297 10*3/uL (ref 150–400)
RBC: 4.89 MIL/uL (ref 3.87–5.11)
RDW: 12.6 % (ref 11.5–15.5)
WBC: 13.6 10*3/uL — ABNORMAL HIGH (ref 4.0–10.5)
nRBC: 0 % (ref 0.0–0.2)

## 2021-05-17 LAB — URINALYSIS, ROUTINE W REFLEX MICROSCOPIC
Bilirubin Urine: NEGATIVE
Glucose, UA: 50 mg/dL — AB
Ketones, ur: NEGATIVE mg/dL
Nitrite: POSITIVE — AB
Protein, ur: NEGATIVE mg/dL
Specific Gravity, Urine: 1.019 (ref 1.005–1.030)
pH: 6 (ref 5.0–8.0)

## 2021-05-17 LAB — WET PREP, GENITAL
Clue Cells Wet Prep HPF POC: NONE SEEN
Sperm: NONE SEEN
Trich, Wet Prep: NONE SEEN
WBC, Wet Prep HPF POC: <10 (ref <10)
Yeast Wet Prep HPF POC: NONE SEEN

## 2021-05-17 LAB — BASIC METABOLIC PANEL
Anion gap: 8 (ref 5–15)
BUN: 7 mg/dL (ref 6–20)
CO2: 27 mmol/L (ref 22–32)
Calcium: 9.1 mg/dL (ref 8.9–10.3)
Chloride: 100 mmol/L (ref 98–111)
Creatinine, Ser: 0.74 mg/dL (ref 0.44–1.00)
GFR, Estimated: >60 mL/min (ref >60)
Glucose, Bld: 159 mg/dL — ABNORMAL HIGH (ref 70–99)
Potassium: 3.1 mmol/L — ABNORMAL LOW (ref 3.5–5.1)
Sodium: 135 mmol/L (ref 135–145)

## 2021-05-17 LAB — PREGNANCY, URINE: Preg Test, Ur: NEGATIVE

## 2021-05-17 MED ORDER — DOXYCYCLINE HYCLATE 100 MG PO CAPS: 100.0000 mg | ORAL_CAPSULE | Freq: Two times a day (BID) | ORAL | 0 refills | Status: AC

## 2021-05-17 MED ORDER — AMOXICILLIN-POT CLAVULANATE 875-125 MG PO TABS
1.0000 | ORAL_TABLET | Freq: Two times a day (BID) | ORAL | 0 refills | Status: AC
Start: 2021-05-17 — End: 2021-05-24

## 2021-05-17 MED ORDER — SODIUM CHLORIDE 0.9 % IV BOLUS
500.0000 mL | Freq: Once | INTRAVENOUS | Status: AC
  Administered 2021-05-17: 500 mL via INTRAVENOUS

## 2021-05-17 MED ORDER — SODIUM CHLORIDE 0.9 % IV SOLN
1.0000 g | Freq: Once | INTRAVENOUS | Status: AC
  Administered 2021-05-17: 1 g via INTRAVENOUS
  Filled 2021-05-17: qty 10

## 2021-05-17 NOTE — ED Triage Notes (Signed)
Patient reports she had a dental infection in the right upper molar and now it has ruptured and right right side of her face is swollen  also complains of hematuria which is a recurring thing.  ?

## 2021-05-17 NOTE — ED Notes (Signed)
ED Provider at bedside. 

## 2021-05-17 NOTE — Discharge Instructions (Addendum)
Dental pain-likely you have a dental infection starting you on Augmentin please take as prescribed recommend over-the-counter pain medications follow-up with the dentist have provided you above ?UTI-I given you Augmentin please take as prescribed please stay hydrated use over-the-counter pain medication as needed follow-up with urology and/or the health department for further evaluation ?STD exposure-given a dose of antibiotics here I have started you on doxycycline please take this to help cover you for an STD.  Please follow the health department for further evaluation ?Constipation-seen on CT scan please emergency hydrated eat plenty of fiber exercise I recommend taking MiraLAX does help with the bowel movements. ? ?Come back to the emergency department if you develop chest pain, shortness of breath, severe abdominal pain, uncontrolled nausea, vomiting, diarrhea. ? ?

## 2021-05-17 NOTE — ED Provider Notes (Signed)
?[place] [facility] EMERGENCY DEPARTMENT ?Provider Note ? ? ?CSN: [id number] ?Arrival date & time: 05/17/21  1543 ? ?  ? ?History ? ?Chief Complaint  ?Patient presents with  ? Dental Pain  ? ? ?[name] [name] is a 41 y.o. female. ? ?HPI ? ?Patient with medical history including opioid dependency, presents to the  with complaints of right-sided facial swelling and dysuria.  Patient states that swelling started last night, states that she has a blister in her r right bottom jaw, has had this for months, states that it popped and then she had pain in her right jaw, and that she had some facial swelling, denies any throat tongue lip swelling difficulty breathing or swallowing no chest pain shortness of breath fevers or chills.  He states that she has not seen dentist in a long time and states she frequently gets dental infections.  She also notes that she is having some hematuria and dysuria.  States this all started yesterday, she says that she had some slight flank tenderness, but no suprapubic pain stomach pains nausea vomiting diarrhea states that she noticed some vaginal discharge white in nature, denies any vaginal bleeding pelvic pain vaginal itchiness.  States she is sexually active, recently had sexual intercourse 2 days ago, she has an IUD in place has no other complaints ? ?Reviewed patient's chart been seen in the past for UTI/pyelo-, was started on antibiotics and discharged..  She frequently gets urinary tract infections. ? ?Home Medications ?Prior to Admission medications   ?Medication Sig Start Date End Date Taking? Authorizing Provider  ?amoxicillin-clavulanate (AUGMENTIN) 875-125 MG tablet Take 1 tablet by mouth every 12 (twelve) hours for 7 days. 05/17/21 05/24/21 Yes Carroll Sage, PA-C  ?doxycycline (VIBRAMYCIN) 100 MG capsule Take 1 capsule (100 mg total) by mouth 2 (two) times daily for 7 days. 05/17/21 05/24/21 Yes Carroll Sage, PA-C  ?buprenorphine (SUBUTEX) 8 MG SUBL Place 8 mg under the tongue  3 (three) times daily.    [provider]  ?Buprenorphine HCl-Naloxone HCl (SUBOXONE SL) Place under the tongue.    [provider]  ?ibuprofen (ADVIL) 600 MG tablet Take 1 tablet (600 mg total) by mouth every 6 (six) hours as needed. 11/25/20   Domenick Gong, MD  ?nitrofurantoin, macrocrystal-monohydrate, (MACROBID) 100 MG capsule Take 1 capsule (100 mg total) by mouth 2 (two) times daily. 03/01/21   Rhys Martini, PA-C  ?phenazopyridine (PYRIDIUM) 200 MG tablet Take 1 tablet (200 mg total) by mouth 3 (three) times daily as needed for pain. 11/25/20   Domenick Gong, MD  ?   ? ?Allergies    ?Patient has no known allergies.   ? ?Review of Systems   ?Review of Systems  ?Constitutional:  Negative for chills and fever.  ?HENT:  Positive for dental problem and facial swelling. Negative for sore throat and trouble swallowing.   ?Respiratory:  Negative for shortness of breath.   ?Cardiovascular:  Negative for chest pain.  ?Gastrointestinal:  Negative for abdominal pain, diarrhea and vomiting.  ?Genitourinary:  Positive for dysuria, flank pain and hematuria. Negative for pelvic pain, vaginal bleeding and vaginal pain.  ?Neurological:  Negative for headaches.  ? ?Physical Exam ?Updated Vital Signs ?BP (!) 135/99 (BP Location: Right Arm)   Pulse (!) 110   Temp 99.8 ?F (37.7 ?C) (Oral)   Resp 16   Ht 5\' 2"  (1.575 m)   Wt 49.9 kg   SpO2 100%   BMI 20.12 kg/m?  ?Physical Exam ?Vitals and  nursing note reviewed.  ?Constitutional:   ?   General: She is not in acute distress. ?   Appearance: She is not ill-appearing.  ?HENT:  ?   Head: Normocephalic and atraumatic.  ?   Nose: No congestion.  ?Eyes:  ?   Conjunctiva/sclera: Conjunctivae normal.  ?Cardiovascular:  ?   Rate and Rhythm: Normal rate and regular rhythm.  ?   Pulses: Normal pulses.  ?   Heart sounds: No murmur heard. ?  No friction rub. No gallop.  ?Pulmonary:  ?   Effort: No respiratory distress.  ?   Breath sounds: No wheezing, rhonchi  or rales.  ?Abdominal:  ?   Palpations: Abdomen is soft.  ?   Tenderness: There is no abdominal tenderness. There is left CVA tenderness. There is no right CVA tenderness.  ?   Comments: Abdomen nondistended normal bowel sounds, dull to percussion, no tenderness to my exam, no guarding rebound, peritoneal sign, negative Murphy sign McBurney point, no side tenderness but some slight left-sided  CVA tenderness  ?Musculoskeletal:  ?   Right lower leg: No edema.  ?   Left lower leg: No edema.  ?Skin: ?   General: Skin is warm and dry.  ?Neurological:  ?   Mental Status: She is alert.  ?Psychiatric:     ?   Mood and Affect: Mood normal.  ? ? ?ED Results / Procedures / Treatments   ?Labs ?(all labs ordered are listed, but only abnormal results are displayed) ?Labs Reviewed  ?URINALYSIS, ROUTINE W REFLEX MICROSCOPIC - Abnormal; Notable for the following components:  ?    Result Value  ? APPearance HAZY (*)   ? Glucose, UA 50 (*)   ? Hgb urine dipstick MODERATE (*)   ? Nitrite POSITIVE (*)   ? Leukocytes,Ua TRACE (*)   ? Bacteria, UA MANY (*)   ? All other components within normal limits  ?BASIC METABOLIC PANEL - Abnormal; Notable for the following components:  ? Potassium 3.1 (*)   ? Glucose, Bld 159 (*)   ? All other components within normal limits  ?CBC WITH DIFFERENTIAL/PLATELET - Abnormal; Notable for the following components:  ? WBC 13.6 (*)   ? Hemoglobin 15.2 (*)   ? Neutro Abs 11.8 (*)   ? All other components within normal limits  ?WET PREP, GENITAL  ?URINE CULTURE  ?PREGNANCY, URINE  ?GC/CHLAMYDIA PROBE AMP () NOT AT [facility]  ? ? ?EKG ?None ? ?Radiology ?CT Renal Stone Study ? ?Result Date: 05/17/2021 ?CLINICAL DATA:  Flank pain EXAM: CT ABDOMEN AND PELVIS WITHOUT CONTRAST TECHNIQUE: Multidetector CT imaging of the abdomen and pelvis was performed following the standard protocol without IV contrast. RADIATION DOSE REDUCTION: This exam was performed according to the departmental dose-optimization program  which includes automated exposure control, adjustment of the mA and/or kV according to patient size and/or use of iterative reconstruction technique. COMPARISON:  None. FINDINGS: Lower chest: No acute abnormality. Hepatobiliary: No focal liver abnormality is seen. No gallstones, gallbladder Notaro thickening, or biliary dilatation. Pancreas: Unremarkable. No pancreatic ductal dilatation or surrounding inflammatory changes. Spleen: Normal in size without focal abnormality. Adrenals/Urinary Tract: Adrenal glands appear normal. No nephrolithiasis or hydronephrosis identified bilaterally. Stomach/Bowel: No bowel obstruction, free air or pneumatosis. No bowel Prim edema identified. Large amount of retained fecal material in the colon and rectum. No evidence of acute appendicitis. Vascular/Lymphatic: No significant vascular findings are present. No enlarged abdominal or pelvic lymph nodes. Reproductive: IUD in the uterus. No suspicious  adnexal mass visualized. Other: No ascites. Musculoskeletal: No suspicious bony lesions identified. IMPRESSION: 1. No nephrolithiasis or hydronephrosis identified. 2. Large amount of retained fecal material noted. Electronically Signed   By: Jannifer Hickelaney  Williams M.D.   On: 05/17/2021 17:20   ? ?Procedures ?Procedures  ? ? ?Medications Ordered in ED ?Medications  ?cefTRIAXone (ROCEPHIN) 1 g in sodium chloride 0.9 % 100 mL IVPB (1 g Intravenous New Bag/Given 05/17/21 1757)  ?sodium chloride 0.9 % bolus 500 mL (500 mLs Intravenous New Bag/Given 05/17/21 1757)  ? ? ?ED Course/ Medical Decision Making/ A&P ?  ?                        ?Medical Decision Making ?Amount and/or Complexity of Data Reviewed ?Labs: ordered. ?Radiology: ordered. ? ? ?This patient presents to the ED for concern of facial swelling and dysuria, this involves an extensive number of treatment options, and is a complaint that carries with it a high risk of complications and morbidity.  The differential diagnosis includes Ludwig  angina, peritonsillar abscess, UTI, pyelo-, PID ? ? ? ?Additional history obtained: ? ?Additional history obtained from N/A ?External records from outside source obtained and reviewed including previous urgent care

## 2021-05-19 LAB — GC/CHLAMYDIA PROBE AMP (~~LOC~~) NOT AT ARMC
Chlamydia: NEGATIVE
Comment: NEGATIVE
Comment: NORMAL
Neisseria Gonorrhea: NEGATIVE

## 2021-05-20 LAB — URINE CULTURE: Culture: >=100000 — AB

## 2022-03-23 ENCOUNTER — Other Ambulatory Visit: Payer: Self-pay

## 2022-03-23 ENCOUNTER — Emergency Department (HOSPITAL_COMMUNITY)
Admission: EM | Admit: 2022-03-23 | Discharge: 2022-03-23 | Disposition: A | Discharge status: Home / Self Care | Payer: Medicaid Other | Attending: Emergency Medicine | Admitting: Emergency Medicine

## 2022-03-23 DIAGNOSIS — L03211 Cellulitis of face: Secondary | ICD-10-CM | POA: Insufficient documentation

## 2022-03-23 DIAGNOSIS — J329 Chronic sinusitis, unspecified: Secondary | ICD-10-CM

## 2022-03-23 LAB — URINALYSIS, ROUTINE W REFLEX MICROSCOPIC
Bilirubin Urine: NEGATIVE
Glucose, UA: NEGATIVE mg/dL
Hgb urine dipstick: NEGATIVE
Ketones, ur: NEGATIVE mg/dL
Leukocytes,Ua: NEGATIVE
Nitrite: NEGATIVE
Protein, ur: NEGATIVE mg/dL
Specific Gravity, Urine: 1.025 (ref 1.005–1.030)
pH: 7.5 (ref 5.0–8.0)

## 2022-03-23 LAB — COMPREHENSIVE METABOLIC PANEL
ALT: 14 U/L (ref 0–44)
AST: 21 U/L (ref 15–41)
Albumin: 4.1 g/dL (ref 3.5–5.0)
Alkaline Phosphatase: 82 U/L (ref 38–126)
Anion gap: 9 (ref 5–15)
BUN: 10 mg/dL (ref 6–20)
CO2: 26 mmol/L (ref 22–32)
Calcium: 9.2 mg/dL (ref 8.9–10.3)
Chloride: 103 mmol/L (ref 98–111)
Creatinine, Ser: 0.79 mg/dL (ref 0.44–1.00)
GFR, Estimated: >60 mL/min (ref >60)
Glucose, Bld: 112 mg/dL — ABNORMAL HIGH (ref 70–99)
Potassium: 4.2 mmol/L (ref 3.5–5.1)
Sodium: 138 mmol/L (ref 135–145)
Total Bilirubin: 0.4 mg/dL (ref 0.3–1.2)
Total Protein: 7.1 g/dL (ref 6.5–8.1)

## 2022-03-23 LAB — CBC WITH DIFFERENTIAL/PLATELET
Abs Immature Granulocytes: 0.01 10*3/uL (ref 0.00–0.07)
Basophils Absolute: 0.1 10*3/uL (ref 0.0–0.1)
Basophils Relative: 1 %
Eosinophils Absolute: 0.3 10*3/uL (ref 0.0–0.5)
Eosinophils Relative: 3 %
HCT: 41.4 % (ref 36.0–46.0)
Hemoglobin: 13.7 g/dL (ref 12.0–15.0)
Immature Granulocytes: 0 %
Lymphocytes Relative: 29 %
Lymphs Abs: 2.2 10*3/uL (ref 0.7–4.0)
MCH: 31.4 pg (ref 26.0–34.0)
MCHC: 33.1 g/dL (ref 30.0–36.0)
MCV: 95 fL (ref 80.0–100.0)
Monocytes Absolute: 0.6 10*3/uL (ref 0.1–1.0)
Monocytes Relative: 8 %
Neutro Abs: 4.7 10*3/uL (ref 1.7–7.7)
Neutrophils Relative %: 59 %
Platelets: 318 10*3/uL (ref 150–400)
RBC: 4.36 MIL/uL (ref 3.87–5.11)
RDW: 13.2 % (ref 11.5–15.5)
WBC: 7.9 10*3/uL (ref 4.0–10.5)
nRBC: 0 % (ref 0.0–0.2)

## 2022-03-23 LAB — PREGNANCY, URINE: Preg Test, Ur: NEGATIVE

## 2022-03-23 MED ORDER — AMOXICILLIN-POT CLAVULANATE 875-125 MG PO TABS: 1.0000 | ORAL_TABLET | Freq: Two times a day (BID) | ORAL | 0 refills | Status: AC

## 2022-03-23 MED ORDER — HYDROCORTISONE 1 % EX CREA: TOPICAL_CREAM | CUTANEOUS | 0 refills | Status: AC

## 2022-03-23 NOTE — Discharge Instructions (Signed)
Use the steroid cream twice daily to the affected areas of your face.  Please take the antibiotic as directed until finished.  Please follow-up with clinic listed to establish primary care.

## 2022-03-23 NOTE — ED Provider Triage Note (Signed)
Emergency Medicine Provider Triage Evaluation Note  [name] [name] , a 42 y.o. female  was evaluated in triage.  Pt complains of generalized bodyaches, sinus pressure and pain x 2 months and dysuria.  Symptoms have been recurrent and persistent for months.  She states she has been on multiple antibiotics for sinusitis and for urinary tract infections without relief.  She also endorses painful burning rash to her face x 1 week.  She completed a course of Omnicef in December after being diagnosed at another ER facility with pyelonephritis.  She has not had PCP or urology follow-up.  She denies fever or chills, had 1 episode of vomiting while here in the ER that she states had streaks of bright red blood in it.  Denies abdominal pain, persistent vomiting or diarrhea.  Patient seen at [facility] ER in December diagnosed with pyelonephritis, had urine culture that grew less than 10,000 gram-negative rods.  Treated with Omnicef  Review of Systems  Positive: Facial rash, dysuria, sinus pressure pain Negative: Fever, diarrhea, abdominal pain,  Physical Exam  BP (!) 124/97 (BP Location: Right Arm)   Pulse (!) 107   Temp 98.3 F (36.8 C) (Oral)   Resp 16   Ht 5' 2"$  (1.575 m)   Wt 49.9 kg   SpO2 100%   BMI 20.12 kg/m  Gen:   Awake, no distress   Resp:  Normal effort  MSK:   Moves extremities without difficulty  Other:  Red macular rash along the glabella and bilateral nasolabial folds.  Medical Decision Making  Medically screening exam initiated at 3:29 PM.  Appropriate orders placed.  [name] [name] was informed that the remainder of the evaluation will be completed by another provider, this initial triage assessment does not replace that evaluation, and the importance of remaining in the ED until their evaluation is complete.    Kem Parkinson, PA-C 03/23/22 1538

## 2022-03-23 NOTE — ED Triage Notes (Signed)
Pt c/o sinus pain x 2 months, was given abx but reports no relief. Pt also endorses, painful rash on face x 1 week. Denies fever.Marland Kitchen

## 2022-03-26 NOTE — ED Provider Notes (Signed)
[facility] Provider Note   CSN: [id number] Arrival date & time: 03/23/22  1326     History  Chief Complaint  Patient presents with   Facial Pain    [name] [name] is a 42 y.o. female.  HPI      [name] [name] is a 42 y.o. female who presents to the Emergency Department complaining of sinus pressure and pain x 2 months.  She was initially treated with antibiotics but denies any improvement of her symptoms.  States she is having thick discolored nasal mucus and pressure pain to her face.  She noticed a burning painful rash around her nose and forehead area 1 week ago.  Denies exposures to chemicals, new medications, or plants.  No swelling of her face, she denies fever, difficulty swallowing, shortness of breath and swelling of her lips or tongue.  States she has recurrent UTIs and is concerned that she may have another 1.  She denies any flank pain hematuria she has some urinary frequency   Home Medications Prior to Admission medications   Medication Sig Start Date End Date Taking? Authorizing Provider  amoxicillin-clavulanate (AUGMENTIN) 875-125 MG tablet Take 1 tablet by mouth every 12 (twelve) hours. 03/23/22  Yes Atziri Zubiate, PA-C  hydrocortisone cream 1 % Apply to affected area 2 times daily 03/23/22  Yes Kelda Azad, PA-C  buprenorphine (SUBUTEX) 8 MG SUBL Place 8 mg under the tongue 3 (three) times daily.    [provider]  Buprenorphine HCl-Naloxone HCl (SUBOXONE SL) Place under the tongue.    [provider]  ibuprofen (ADVIL) 600 MG tablet Take 1 tablet (600 mg total) by mouth every 6 (six) hours as needed. 11/25/20   Melynda Ripple, MD  nitrofurantoin, macrocrystal-monohydrate, (MACROBID) 100 MG capsule Take 1 capsule (100 mg total) by mouth 2 (two) times daily. 03/01/21   Hazel Sams, PA-C  phenazopyridine (PYRIDIUM) 200 MG tablet Take 1 tablet (200 mg total) by mouth 3 (three) times daily as  needed for pain. 11/25/20   Melynda Ripple, MD      Allergies    Patient has no known allergies.    Review of Systems   Review of Systems  Constitutional:  Negative for appetite change, chills and fever.  HENT:  Positive for congestion, sinus pressure and sinus pain. Negative for sore throat, trouble swallowing and voice change.   Respiratory:  Negative for cough.   Cardiovascular:  Negative for chest pain.  Gastrointestinal:  Negative for abdominal pain, nausea and vomiting.  Genitourinary:  Positive for dysuria and frequency. Negative for difficulty urinating, flank pain, pelvic pain, vaginal bleeding and vaginal discharge.  Musculoskeletal:  Negative for neck pain and neck stiffness.  Skin:  Positive for rash.  Neurological:  Negative for dizziness, seizures, syncope, weakness and headaches.    Physical Exam Updated Vital Signs BP (!) 124/97 (BP Location: Right Arm)   Pulse (!) 107   Temp 98.3 F (36.8 C) (Oral)   Resp 16   Ht '5\' 2"'$  (1.575 m)   Wt 49.9 kg   SpO2 100%   BMI 20.12 kg/m  Physical Exam Vitals and nursing note reviewed.  Constitutional:      General: She is not in acute distress.    Appearance: Normal appearance. She is not ill-appearing or toxic-appearing.  HENT:     Right Ear: Tympanic membrane and ear canal normal.     Left Ear: Tympanic membrane and ear canal normal.  Nose: Mucosal edema and congestion present. No rhinorrhea.     Right Sinus: Maxillary sinus tenderness present.     Left Sinus: Maxillary sinus tenderness present.     Mouth/Throat:     Mouth: Mucous membranes are moist.     Pharynx: Oropharynx is clear. No oropharyngeal exudate or posterior oropharyngeal erythema.  Eyes:     Conjunctiva/sclera: Conjunctivae normal.     Pupils: Pupils are equal, round, and reactive to light.  Cardiovascular:     Rate and Rhythm: Normal rate and regular rhythm.     Pulses: Normal pulses.  Pulmonary:     Effort: Pulmonary effort is normal. No  respiratory distress.     Breath sounds: No wheezing or rales.  Abdominal:     Tenderness: There is no right CVA tenderness or left CVA tenderness.  Musculoskeletal:        General: Normal range of motion.     Cervical back: Normal range of motion. No rigidity.  Lymphadenopathy:     Cervical: No cervical adenopathy.  Skin:    General: Skin is warm.     Findings: Rash present.     Comments: Erythematous maculopapular rash to the bilateral nasolabial folds and forehead including glabella  Neurological:     General: No focal deficit present.     Mental Status: She is alert.     Sensory: No sensory deficit.     Motor: No weakness.     ED Results / Procedures / Treatments   Labs (all labs ordered are listed, but only abnormal results are displayed) Labs Reviewed  URINALYSIS, ROUTINE W REFLEX MICROSCOPIC - Abnormal; Notable for the following components:      Result Value   APPearance HAZY (*)    All other components within normal limits  COMPREHENSIVE METABOLIC PANEL - Abnormal; Notable for the following components:   Glucose, Bld 112 (*)    All other components within normal limits  PREGNANCY, URINE  CBC WITH DIFFERENTIAL/PLATELET    EKG None  Radiology No results found.  Procedures Procedures    Medications Ordered in ED Medications - No data to display  ED Course/ Medical Decision Making/ A&P                             Medical Decision Making Patient here with sinus symptoms for 2 months.  Has been on antibiotics without improvement.  Also complains of recurrent UTIs.  Some dysuria without flank pain hematuria or fever.  No abdominal pain or vomiting.  Clinically, recurrent sinusitis considered.  Also viral process, rash of the face appears unrelated and likely secondary to dermatitis.  No focal neurodeficits, doubt emergent process  Amount and/or Complexity of Data Reviewed Labs: ordered.    Details: Urinalysis without evidence of infection.  Urine pregnancy  test negative.  Chemistries without derangement.  No evidence of leukocytosis. Discussion of management or test interpretation with external provider(s): Patient here with likely developing cellulitis of her face.  There is no edema periorbital symptoms or symptoms suggestive of anaphylaxis.  No evidence of UTI.  Likely recurrent sinusitis.  Doubt emergent process.   Patient appears appropriate for discharge home.  Risk Prescription drug management.           Final Clinical Impression(s) / ED Diagnoses Final diagnoses:  Sinusitis, unspecified chronicity, unspecified location  Cellulitis of face    Rx / DC Orders ED Discharge Orders  Ordered    amoxicillin-clavulanate (AUGMENTIN) 875-125 MG tablet  Every 12 hours        03/23/22 1807    hydrocortisone cream 1 %        03/23/22 1807              Kem Parkinson, PA-C 03/26/22 1315    Hayden Rasmussen, MD 03/27/22 1031

## 2022-12-15 ENCOUNTER — Emergency Department (HOSPITAL_COMMUNITY)
Admission: EM | Admit: 2022-12-15 | Discharge: 2022-12-16 | Disposition: A | Discharge status: Home / Self Care | Payer: Medicaid Other | Attending: Emergency Medicine | Admitting: Emergency Medicine

## 2022-12-15 ENCOUNTER — Other Ambulatory Visit: Payer: Self-pay

## 2022-12-15 DIAGNOSIS — J0100 Acute maxillary sinusitis, unspecified: Secondary | ICD-10-CM | POA: Diagnosis not present

## 2022-12-15 DIAGNOSIS — R519 Headache, unspecified: Secondary | ICD-10-CM | POA: Diagnosis present

## 2022-12-15 NOTE — ED Triage Notes (Signed)
Patient from home for R side headache and blurry vision in her R eye that started yesterday. Reports some dizziness and light sensitivity. Also reports she feels confused since waking up this afternoon; fell asleep at 1515 today. States she forgot to get her kids from school and got lost multiple times on the way to the hospital. Upon arrival to ER, patient is alert and oriented; rambling speech during triage

## 2022-12-16 ENCOUNTER — Emergency Department (HOSPITAL_COMMUNITY): Payer: Medicaid Other

## 2022-12-16 LAB — CBC WITH DIFFERENTIAL/PLATELET
Abs Immature Granulocytes: 0.02 10*3/uL (ref 0.00–0.07)
Basophils Absolute: 0.1 10*3/uL (ref 0.0–0.1)
Basophils Relative: 1 %
Eosinophils Absolute: 0.2 10*3/uL (ref 0.0–0.5)
Eosinophils Relative: 3 %
HCT: 38.6 % (ref 36.0–46.0)
Hemoglobin: 13.2 g/dL (ref 12.0–15.0)
Immature Granulocytes: 0 %
Lymphocytes Relative: 29 %
Lymphs Abs: 2.5 10*3/uL (ref 0.7–4.0)
MCH: 32.1 pg (ref 26.0–34.0)
MCHC: 34.2 g/dL (ref 30.0–36.0)
MCV: 93.9 fL (ref 80.0–100.0)
Monocytes Absolute: 0.9 10*3/uL (ref 0.1–1.0)
Monocytes Relative: 11 %
Neutro Abs: 4.8 10*3/uL (ref 1.7–7.7)
Neutrophils Relative %: 56 %
Platelets: 265 10*3/uL (ref 150–400)
RBC: 4.11 MIL/uL (ref 3.87–5.11)
RDW: 12.3 % (ref 11.5–15.5)
WBC: 8.5 10*3/uL (ref 4.0–10.5)
nRBC: 0 % (ref 0.0–0.2)

## 2022-12-16 LAB — BASIC METABOLIC PANEL
Anion gap: 8 (ref 5–15)
BUN: 16 mg/dL (ref 6–20)
CO2: 26 mmol/L (ref 22–32)
Calcium: 9 mg/dL (ref 8.9–10.3)
Chloride: 103 mmol/L (ref 98–111)
Creatinine, Ser: 0.81 mg/dL (ref 0.44–1.00)
GFR, Estimated: >60 mL/min (ref >60)
Glucose, Bld: 101 mg/dL — ABNORMAL HIGH (ref 70–99)
Potassium: 3.6 mmol/L (ref 3.5–5.1)
Sodium: 137 mmol/L (ref 135–145)

## 2022-12-16 MED ORDER — KETOROLAC TROMETHAMINE 30 MG/ML IJ SOLN
30.0000 mg | Freq: Once | INTRAMUSCULAR | Status: AC
  Administered 2022-12-16: 30 mg via INTRAVENOUS
  Filled 2022-12-16: qty 1

## 2022-12-16 MED ORDER — DOXYCYCLINE HYCLATE 100 MG PO TABS
100.0000 mg | ORAL_TABLET | Freq: Once | ORAL | Status: AC
  Administered 2022-12-16: 100 mg via ORAL
  Filled 2022-12-16: qty 1

## 2022-12-16 MED ORDER — SODIUM CHLORIDE 0.9 % IV BOLUS
1000.0000 mL | Freq: Once | INTRAVENOUS | Status: AC
  Administered 2022-12-16: 1000 mL via INTRAVENOUS

## 2022-12-16 MED ORDER — DOXYCYCLINE HYCLATE 100 MG PO CAPS: 100.0000 mg | ORAL_CAPSULE | Freq: Two times a day (BID) | ORAL | 0 refills | Status: AC

## 2022-12-16 NOTE — ED Provider Notes (Signed)
[place] EMERGENCY DEPARTMENT AT [facility] Provider Note   CSN: [id number] Arrival date & time: 12/15/22  2329     History  Chief Complaint  Patient presents with   Headache   Blurred Vision    [name] [name] is a 42 y.o. female.  Patient is a 42 year old female with past medical history of prior substance abuse.  Patient presenting today with complaints of headache.  She describes pain to the right side of her head that started yesterday.  She describes it as a "spike" in the side of her head.  Pain has been constant and associated with blurred vision.  She describes disorientation and states that she forgot to pick up her child from school today.  She has a history of sinus infections, but this feels different.  No aggravating or alleviating factors.  She has not taken anything for her symptoms.  The history is provided by the patient.       Home Medications Prior to Admission medications   Medication Sig Start Date End Date Taking? Authorizing Provider  amoxicillin-clavulanate (AUGMENTIN) 875-125 MG tablet Take 1 tablet by mouth every 12 (twelve) hours. 03/23/22   Triplett, Tammy, PA-C  buprenorphine (SUBUTEX) 8 MG SUBL Place 8 mg under the tongue 3 (three) times daily.    [provider]  Buprenorphine HCl-Naloxone HCl (SUBOXONE SL) Place under the tongue.    [provider]  hydrocortisone cream 1 % Apply to affected area 2 times daily 03/23/22   Triplett, Tammy, PA-C  ibuprofen (ADVIL) 600 MG tablet Take 1 tablet (600 mg total) by mouth every 6 (six) hours as needed. 11/25/20   Domenick Gong, MD  nitrofurantoin, macrocrystal-monohydrate, (MACROBID) 100 MG capsule Take 1 capsule (100 mg total) by mouth 2 (two) times daily. 03/01/21   Rhys Martini, PA-C  phenazopyridine (PYRIDIUM) 200 MG tablet Take 1 tablet (200 mg total) by mouth 3 (three) times daily as needed for pain. 11/25/20   Domenick Gong, MD      Allergies    Patient has no  known allergies.    Review of Systems   Review of Systems  All other systems reviewed and are negative.   Physical Exam Updated Vital Signs BP 121/83   Ht 5\' 2"  (1.575 m)   Wt 49.9 kg   BMI 20.12 kg/m  Physical Exam Vitals and nursing note reviewed.  Constitutional:      General: She is not in acute distress.    Appearance: She is well-developed. She is not diaphoretic.  HENT:     Head: Normocephalic and atraumatic.  Eyes:     Extraocular Movements: Extraocular movements intact.     Pupils: Pupils are equal, round, and reactive to light.  Cardiovascular:     Rate and Rhythm: Normal rate and regular rhythm.     Heart sounds: No murmur heard.    No friction rub. No gallop.  Pulmonary:     Effort: Pulmonary effort is normal. No respiratory distress.     Breath sounds: Normal breath sounds. No wheezing.  Abdominal:     General: Bowel sounds are normal. There is no distension.     Palpations: Abdomen is soft.     Tenderness: There is no abdominal tenderness.  Musculoskeletal:        General: Normal range of motion.     Cervical back: Normal range of motion and neck supple.  Skin:    General: Skin is warm and dry.  Neurological:  General: No focal deficit present.     Mental Status: She is alert and oriented to person, place, and time.     Cranial Nerves: No cranial nerve deficit, dysarthria or facial asymmetry.     ED Results / Procedures / Treatments   Labs (all labs ordered are listed, but only abnormal results are displayed) Labs Reviewed  BASIC METABOLIC PANEL  CBC WITH DIFFERENTIAL/PLATELET    EKG None  Radiology No results found.  Procedures Procedures    Medications Ordered in ED Medications  sodium chloride 0.9 % bolus 1,000 mL (has no administration in time range)  ketorolac (TORADOL) 30 MG/ML injection 30 mg (has no administration in time range)    ED Course/ Medical Decision Making/ A&P  Patient is a 42 year old female presenting with  complaints of right-sided headache since earlier today.  She also reports memory loss and blurry vision as described in the HPI.  Patient arrives here with stable vital signs and is afebrile.  She is neurologically intact and physical examination is unremarkable.  Laboratory studies obtained including CBC and basic metabolic panel, both of which are unremarkable.  CT scan of the head obtained showing marked severity right maxillary sinus disease.  Patient has been given IV fluids and Toradol.  She was also given doxycycline for the sinus infection that I believe is the cause of her symptoms.  Patient to be discharged with doxycycline and as needed return.  CT scan shows no evidence for other abnormality.  I highly doubt stroke.  Final Clinical Impression(s) / ED Diagnoses Final diagnoses:  None    Rx / DC Orders ED Discharge Orders     None         Geoffery Lyons, MD 12/16/22 [phone]

## 2022-12-16 NOTE — ED Notes (Signed)
Patient transported to CT 

## 2022-12-16 NOTE — Discharge Instructions (Signed)
Begin taking doxycycline as prescribed.  Take ibuprofen 400 mg rotated with Tylenol 650 mg every 3 hours as needed for pain.  Follow-up with primary doctor if not improving in the next few days, and return to the ER if symptoms significantly worsen or change.

## 2023-04-15 ENCOUNTER — Other Ambulatory Visit (HOSPITAL_COMMUNITY): Payer: Self-pay | Admitting: Family Medicine

## 2023-04-15 DIAGNOSIS — Z1231 Encounter for screening mammogram for malignant neoplasm of breast: Secondary | ICD-10-CM

## 2023-04-25 ENCOUNTER — Inpatient Hospital Stay (HOSPITAL_COMMUNITY): Admission: RE | Admit: 2023-04-25 | Source: Ambulatory Visit

## 2023-09-14 ENCOUNTER — Other Ambulatory Visit: Payer: Self-pay | Admitting: Medical Genetics

## 2023-11-07 ENCOUNTER — Encounter: Admitting: Obstetrics & Gynecology

## 2023-11-14 ENCOUNTER — Other Ambulatory Visit: Payer: Self-pay | Admitting: *Deleted

## 2023-11-14 DIAGNOSIS — Z006 Encounter for examination for normal comparison and control in clinical research program: Secondary | ICD-10-CM

## 2023-11-27 IMAGING — CT CT RENAL STONE PROTOCOL
2 of 4 series · 16 of 46 positions shown, 18 images · non-contrast
Comparison: None.

CLINICAL DATA: Flank pain



[Series 2: axial st · axial · 0.92mm/px · z∈[+1408,+1763]mm · 13 of 81 slices shown, 15 images]
[im 5/81  soft-tissue]
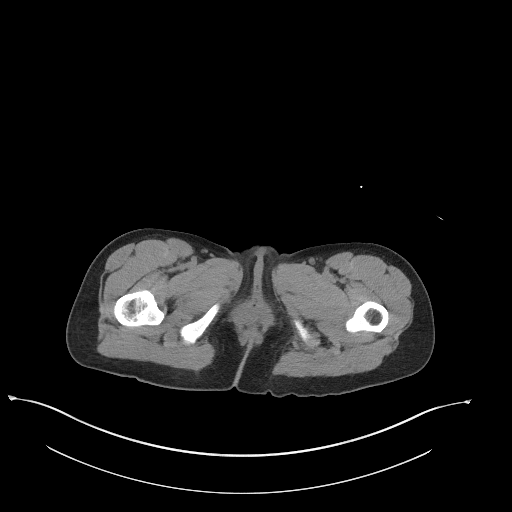
[im 5/81  bone]
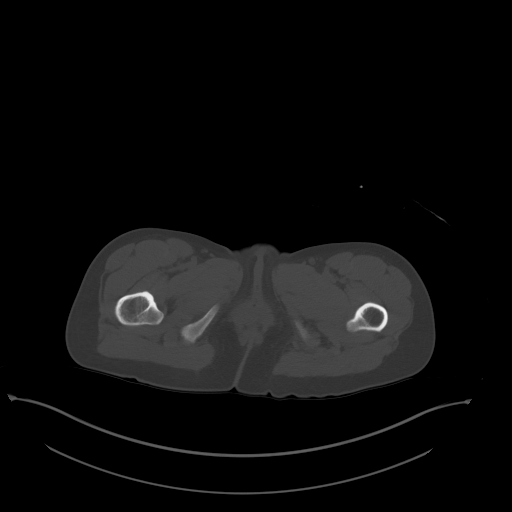
[im 10/81  soft-tissue]
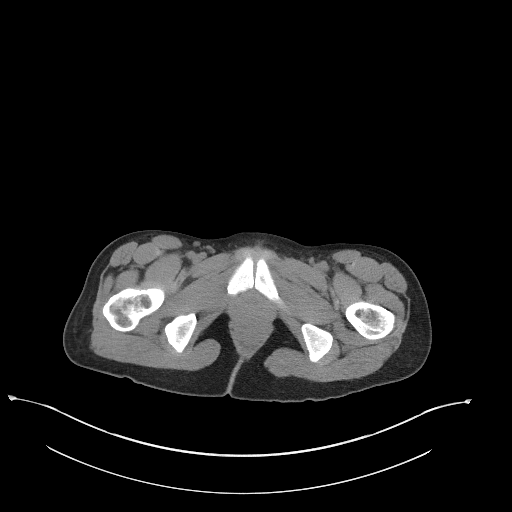
[im 19/81  soft-tissue]
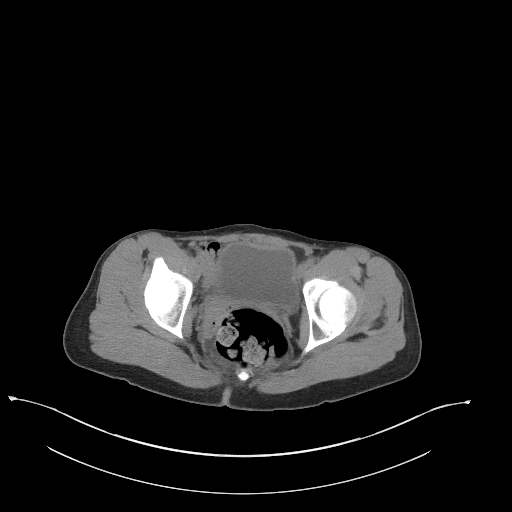
[im 24/81  soft-tissue]
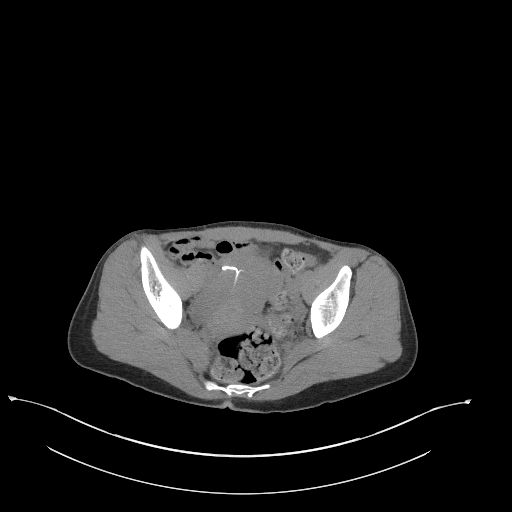
[im 29/81  soft-tissue]
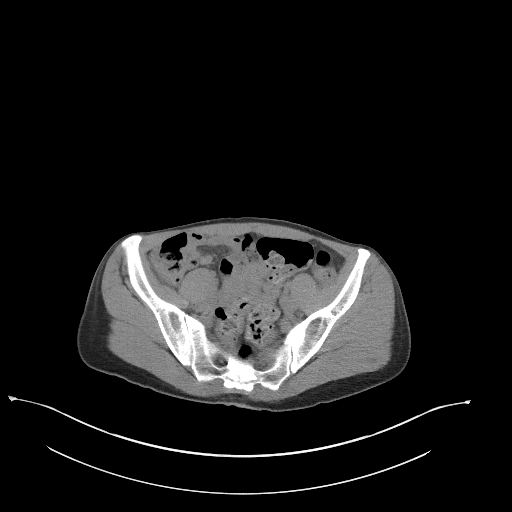
[im 33/81  soft-tissue]
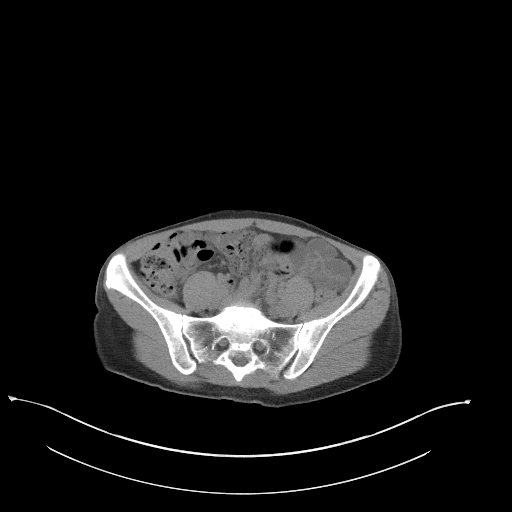
[im 43/81  soft-tissue]
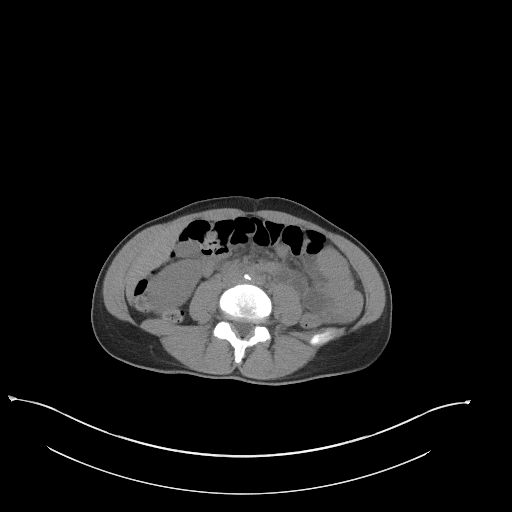
[im 48/81  soft-tissue]
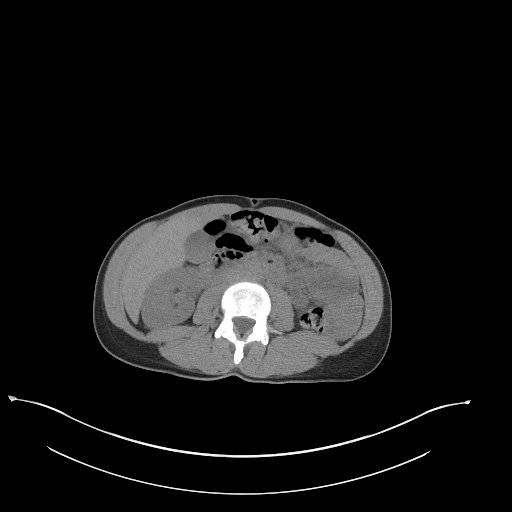
[im 52/81  soft-tissue]
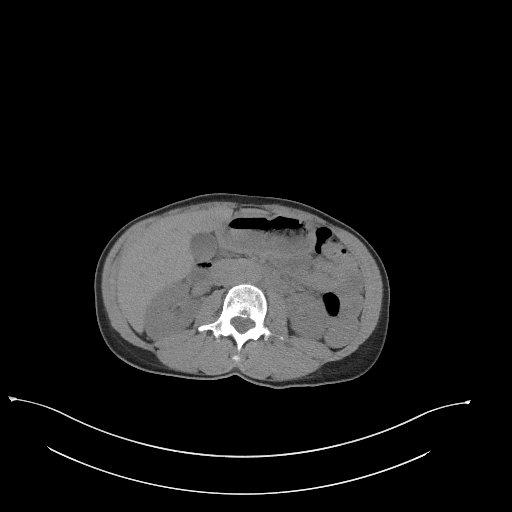
[im 52/81  bone]
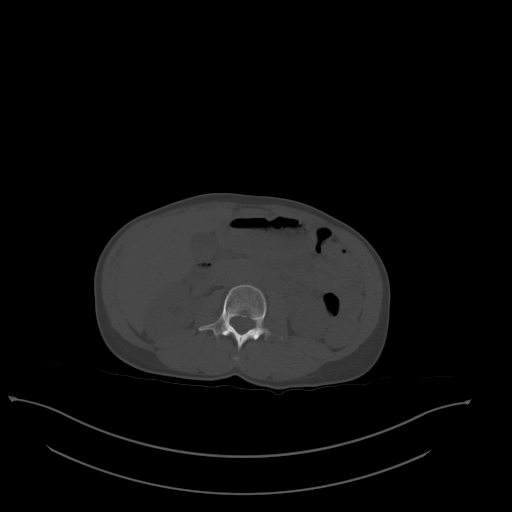
[im 57/81  soft-tissue]
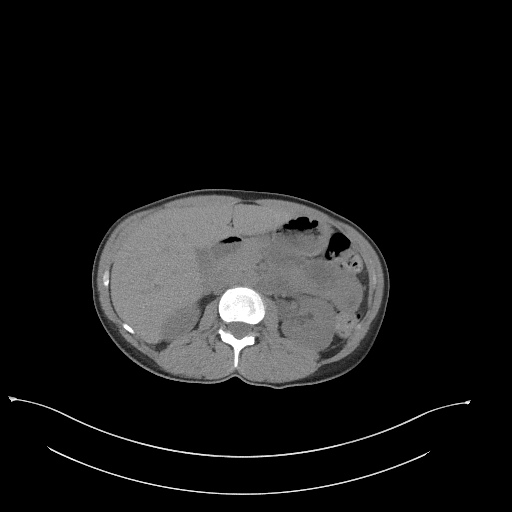
[im 62/81  soft-tissue]
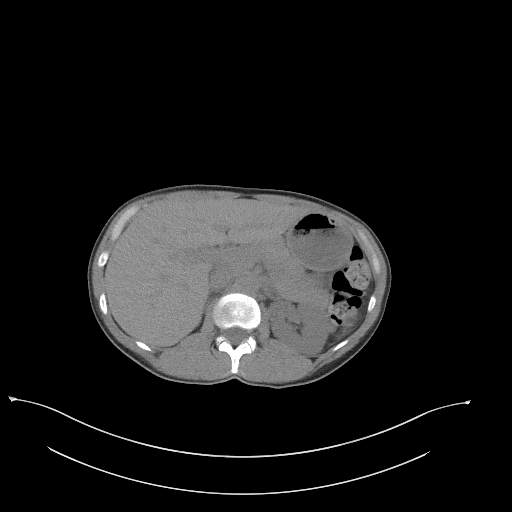
[im 71/81  soft-tissue]
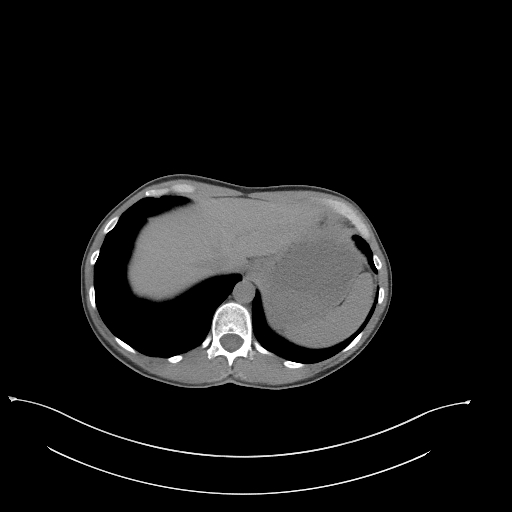
[im 76/81  soft-tissue]
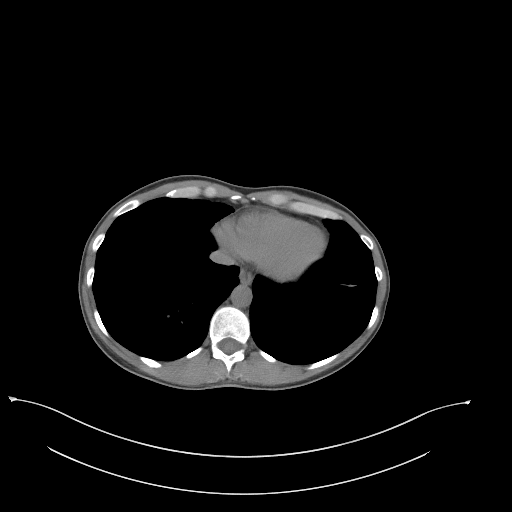

[Series 5: coronal st · coronal · 0.67mm/px · 3 of 77 slices shown]
[im 26/77  soft-tissue]
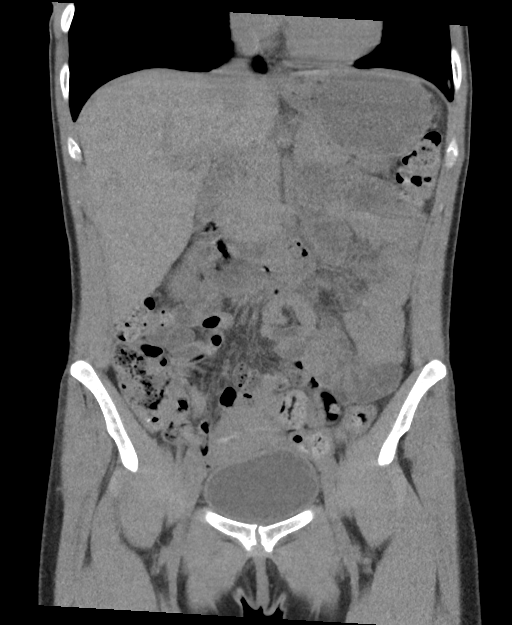
[im 34/77  soft-tissue]
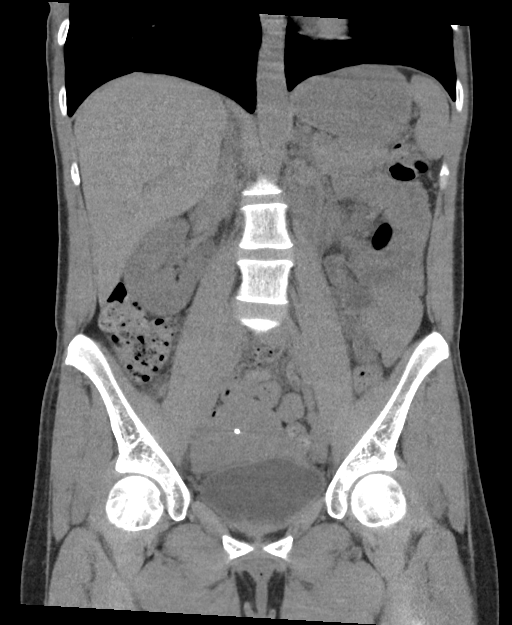
[im 43/77  soft-tissue]
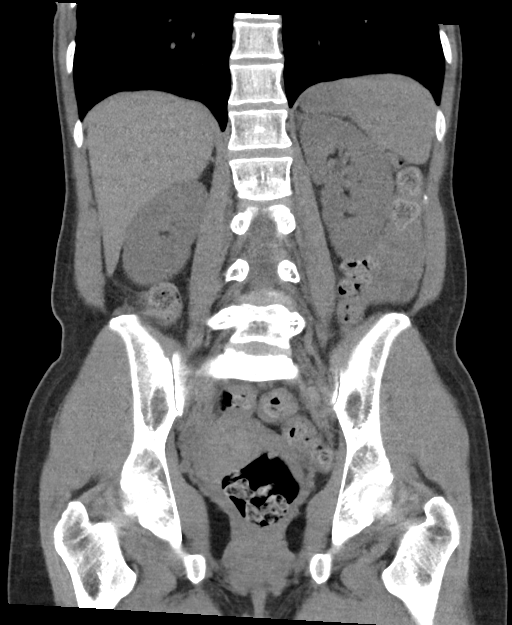

[16 of 46 positions shown; findings below may reference images not displayed]

FINDINGS: Lower chest: No acute abnormality.

Hepatobiliary: No focal liver abnormality is seen. No gallstones,
gallbladder wall thickening, or biliary dilatation.

Pancreas: Unremarkable. No pancreatic ductal dilatation or
surrounding inflammatory changes.

Spleen: Normal in size without focal abnormality.

Adrenals/Urinary Tract: Adrenal glands appear normal. No
nephrolithiasis or hydronephrosis identified bilaterally.

Stomach/Bowel: No bowel obstruction, free air or pneumatosis. No
bowel wall edema identified. Large amount of retained fecal material
in the colon and rectum. No evidence of acute appendicitis.

Vascular/Lymphatic: No significant vascular findings are present. No
enlarged abdominal or pelvic lymph nodes.

Reproductive: IUD in the uterus. No suspicious adnexal mass
visualized.

Other: No ascites.

Musculoskeletal: No suspicious bony lesions identified.
IMPRESSION: 1. No nephrolithiasis or hydronephrosis identified.
2. Large amount of retained fecal material noted.
